# Patient Record
Sex: Female | Born: 1955
Health system: Southern US, Community
[De-identification: ages and names within clinical notes are randomized; demographics above are authoritative.]

## PROBLEM LIST (undated history)

## (undated) ENCOUNTER — Ambulatory Visit: Payer: Self-pay

## (undated) DIAGNOSIS — F419 Anxiety disorder, unspecified: Secondary | ICD-10-CM

## (undated) DIAGNOSIS — M19029 Primary osteoarthritis, unspecified elbow: Secondary | ICD-10-CM

## (undated) DIAGNOSIS — T7840XA Allergy, unspecified, initial encounter: Secondary | ICD-10-CM

## (undated) DIAGNOSIS — F32A Depression, unspecified: Secondary | ICD-10-CM

## (undated) DIAGNOSIS — F329 Major depressive disorder, single episode, unspecified: Secondary | ICD-10-CM

## (undated) HISTORY — DX: Anxiety disorder, unspecified: F41.9

## (undated) HISTORY — DX: Major depressive disorder, single episode, unspecified: F32.9

## (undated) HISTORY — PX: ABDOMINAL HYSTERECTOMY: SHX81

## (undated) HISTORY — DX: Allergy, unspecified, initial encounter: T78.40XA

## (undated) HISTORY — PX: TUBAL LIGATION: SHX77

## (undated) HISTORY — DX: Depression, unspecified: F32.A

## (undated) HISTORY — PX: NECK DISSECTION: SUR422

---

## 1997-11-02 ENCOUNTER — Other Ambulatory Visit: Admission: RE | Admit: 1997-11-02 | Discharge: 1997-11-02 | Payer: Self-pay | Admitting: Gynecology

## 1998-11-06 ENCOUNTER — Other Ambulatory Visit: Admission: RE | Admit: 1998-11-06 | Discharge: 1998-11-06 | Payer: Self-pay | Admitting: Gynecology

## 1999-07-02 ENCOUNTER — Encounter (INDEPENDENT_AMBULATORY_CARE_PROVIDER_SITE_OTHER): Payer: Self-pay

## 1999-07-02 ENCOUNTER — Inpatient Hospital Stay (HOSPITAL_COMMUNITY): Admission: RE | Admit: 1999-07-02 | Discharge: 1999-07-04 | Payer: Self-pay | Admitting: Gynecology

## 2001-10-09 ENCOUNTER — Encounter: Payer: Self-pay | Admitting: Internal Medicine

## 2001-10-09 ENCOUNTER — Encounter: Admission: RE | Admit: 2001-10-09 | Discharge: 2001-10-09 | Payer: Self-pay | Admitting: Internal Medicine

## 2001-11-05 ENCOUNTER — Encounter: Payer: Self-pay | Admitting: Internal Medicine

## 2001-11-05 ENCOUNTER — Encounter: Admission: RE | Admit: 2001-11-05 | Discharge: 2001-11-05 | Payer: Self-pay | Admitting: Internal Medicine

## 2002-03-19 ENCOUNTER — Encounter: Payer: Self-pay | Admitting: Internal Medicine

## 2002-03-19 ENCOUNTER — Encounter: Admission: RE | Admit: 2002-03-19 | Discharge: 2002-03-19 | Payer: Self-pay | Admitting: Internal Medicine

## 2002-08-16 ENCOUNTER — Other Ambulatory Visit: Admission: RE | Admit: 2002-08-16 | Discharge: 2002-08-16 | Payer: Self-pay | Admitting: Gynecology

## 2003-02-18 ENCOUNTER — Encounter: Admission: RE | Admit: 2003-02-18 | Discharge: 2003-02-18 | Payer: Self-pay | Admitting: Internal Medicine

## 2004-10-29 ENCOUNTER — Encounter: Admission: RE | Admit: 2004-10-29 | Discharge: 2004-10-29 | Payer: Self-pay | Admitting: Neurosurgery

## 2004-11-21 ENCOUNTER — Encounter: Admission: RE | Admit: 2004-11-21 | Discharge: 2004-11-21 | Payer: Self-pay | Admitting: Neurosurgery

## 2005-09-08 IMAGING — CT CT HEAD WO/W CM
1 of 2 series · 13 of 30 positions shown, 17 images · IV contrast (omnipaque)
Comparison: none

CLINICAL DATA: Retrobulbar, forehead, and lumbar pain.  Sulfur drug allergy.  (Contrast code: None).
 HEAD CT ? PRE- AND POST- CONTRAST ? 02/18/03
 Cranial CT was performed before and after administration of 100 cc of Omnipaque 300 cc Omnipaque 300 intravenous contrast.

[Series 2: brain · axial · 0.49mm/px · z∈[+27,+145]mm · 13 of 26 slices shown, 17 images]
[im 2/26  brain]
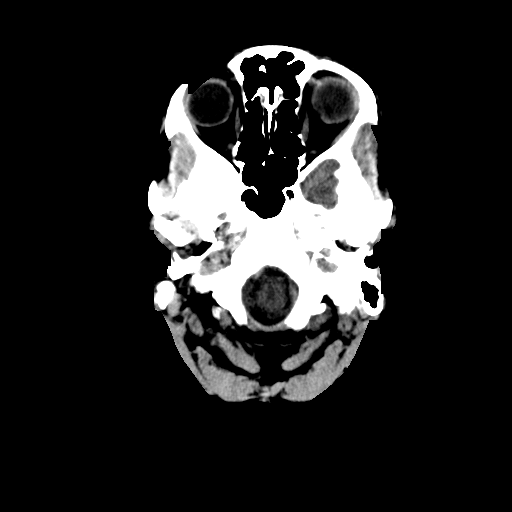
[im 2/26  bone]
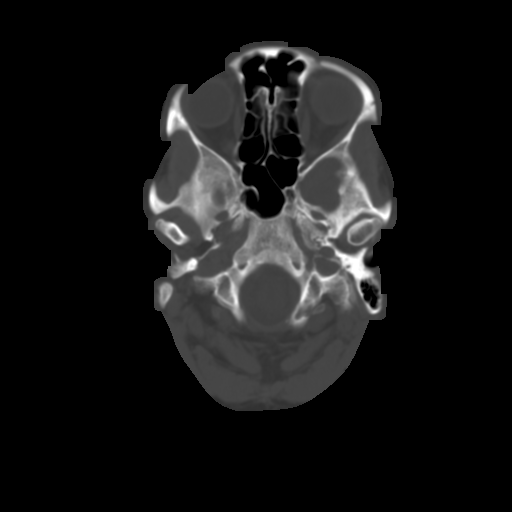
[im 4/26  brain]
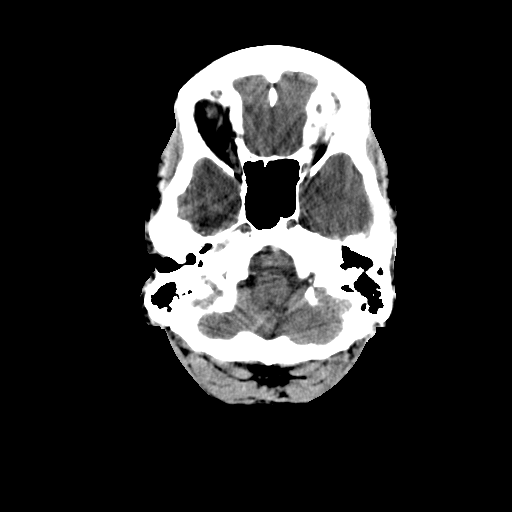
[im 6/26  brain]
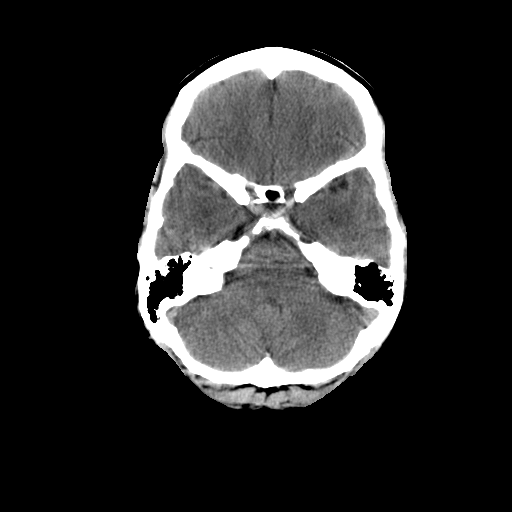
[im 8/26  brain]
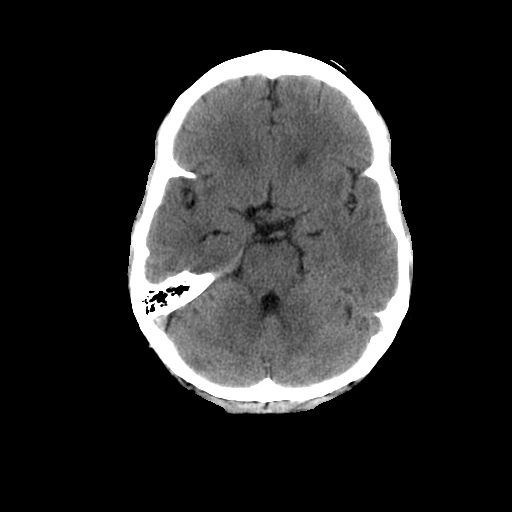
[im 9/26  brain]
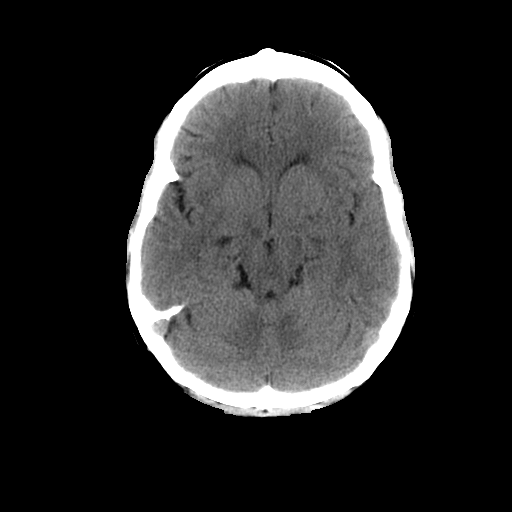
[im 9/26  bone]
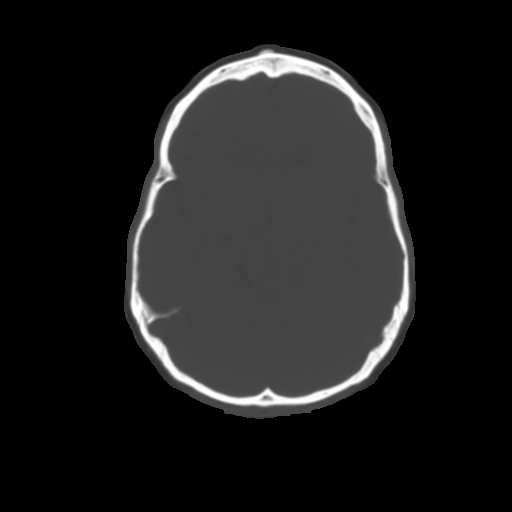
[im 11/26  brain]
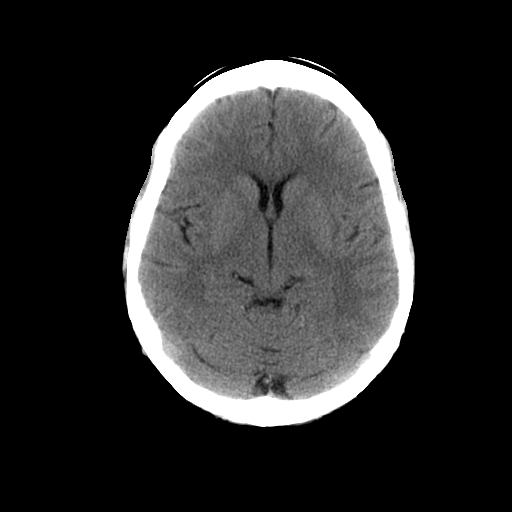
[im 13/26  brain]
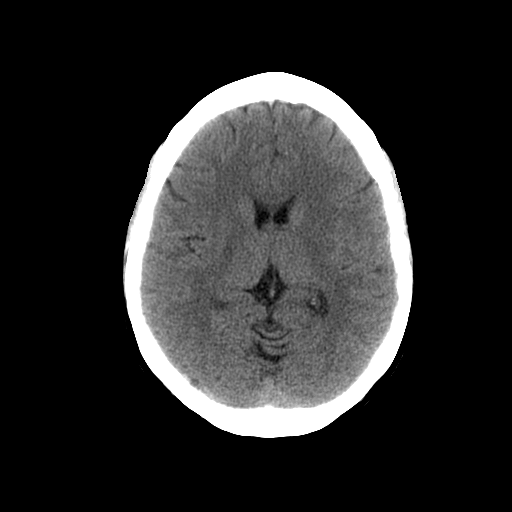
[im 15/26  brain]
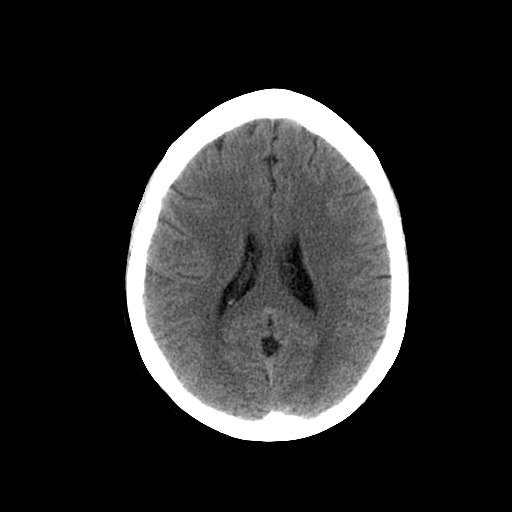
[im 17/26  brain]
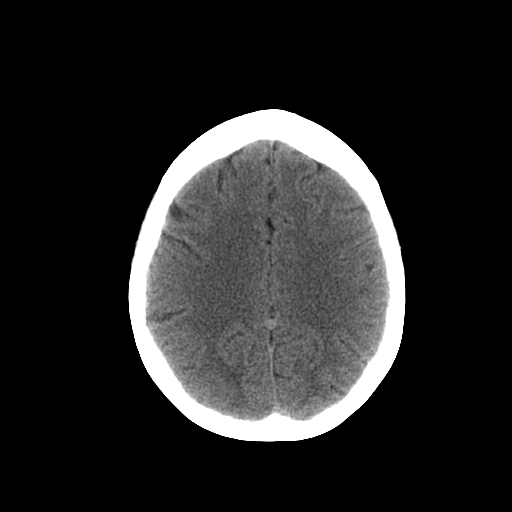
[im 17/26  bone]
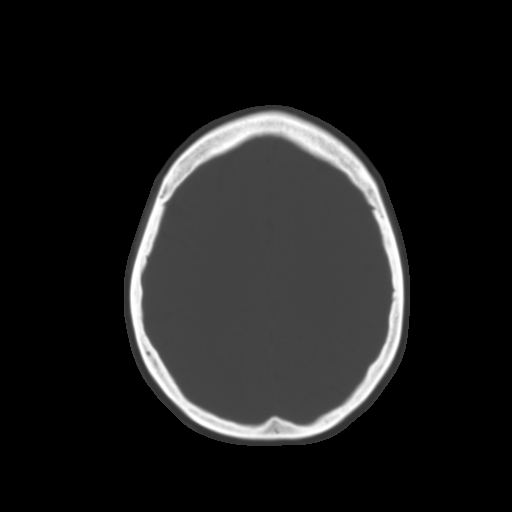
[im 18/26  brain]
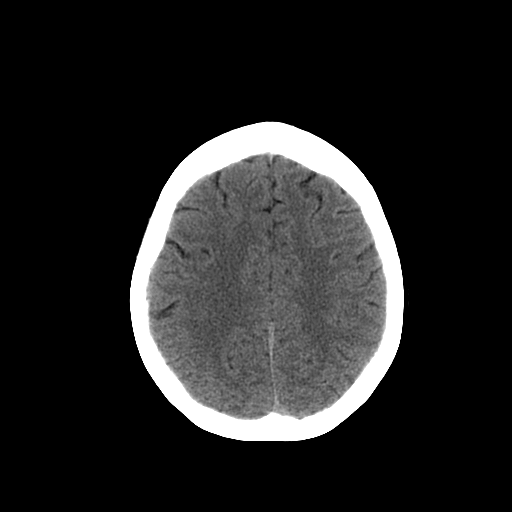
[im 20/26  brain]
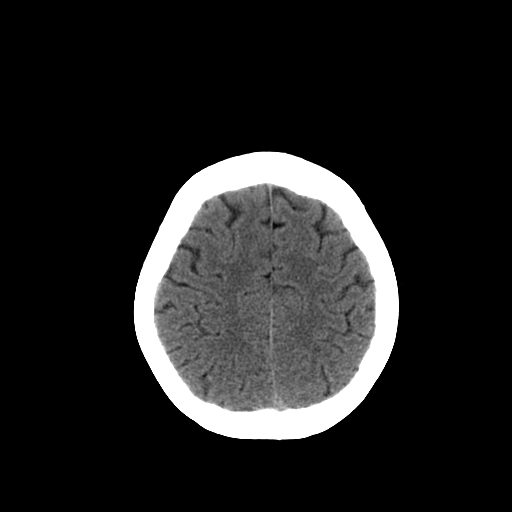
[im 22/26  brain]
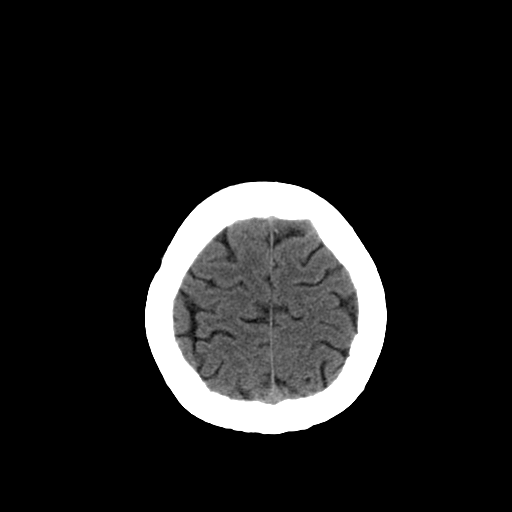
[im 24/26  brain]
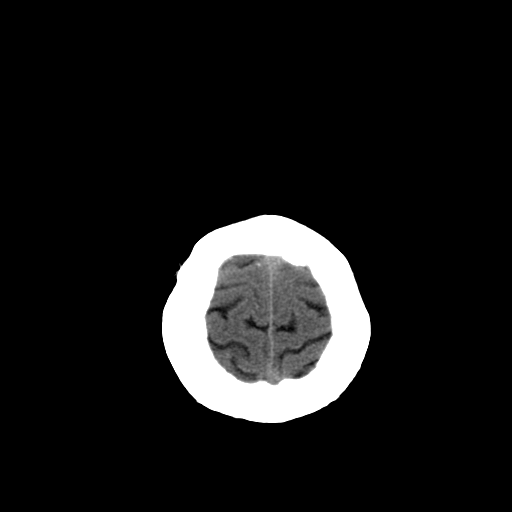
[im 24/26  bone]
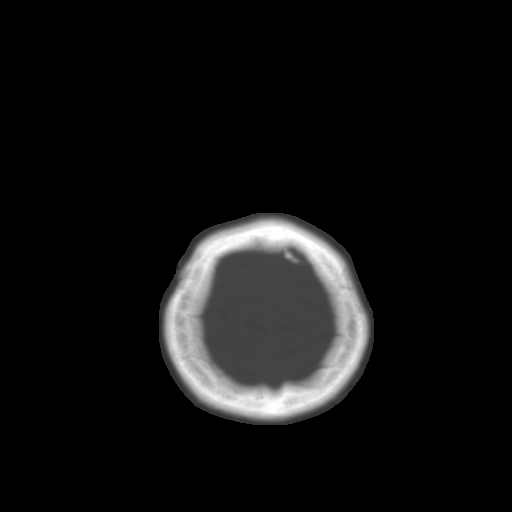

[13 of 30 positions shown; findings below may reference images not displayed]

There is no evidence of enhancing lesions, brain edema, mass effect or intracranial hemorrhage. The ventricles are normal. No extra-axial abnormalities are identified. Bone windows show no significant abnormality.

 No change since the previous [REDACTED] head CT 03/19/02 is noted.  With aid of retrospection a small benign appearing hyperdense well-marginated focus is seen at the midline subgaleal frontal region (image #8, previous image #14).

 IMPRESSION
 Since [REDACTED] head CT 03/19/02, no interval change:
 1.  Well marginated benign appearing frontal midline subgaleal hyperdense focus measuring 2 mm AP x 6 mm wide with no subjacent osseous abnormality.
 2.  Otherwise normal.

## 2006-01-06 ENCOUNTER — Encounter: Admission: RE | Admit: 2006-01-06 | Discharge: 2006-01-06 | Payer: Self-pay | Admitting: Neurosurgery

## 2006-01-27 ENCOUNTER — Encounter: Admission: RE | Admit: 2006-01-27 | Discharge: 2006-01-27 | Payer: Self-pay | Admitting: Neurosurgery

## 2006-04-01 ENCOUNTER — Encounter: Admission: RE | Admit: 2006-04-01 | Discharge: 2006-04-01 | Payer: Self-pay | Admitting: Neurosurgery

## 2006-07-28 ENCOUNTER — Encounter (INDEPENDENT_AMBULATORY_CARE_PROVIDER_SITE_OTHER): Payer: Self-pay | Admitting: Orthopedic Surgery

## 2006-07-28 ENCOUNTER — Ambulatory Visit: Payer: Self-pay | Admitting: Vascular Surgery

## 2006-07-28 ENCOUNTER — Ambulatory Visit (HOSPITAL_COMMUNITY): Admission: RE | Admit: 2006-07-28 | Discharge: 2006-07-28 | Payer: Self-pay | Admitting: Orthopedic Surgery

## 2006-08-05 ENCOUNTER — Encounter (HOSPITAL_COMMUNITY): Admission: RE | Admit: 2006-08-05 | Discharge: 2006-09-04 | Payer: Self-pay | Admitting: Orthopedic Surgery

## 2006-11-05 ENCOUNTER — Encounter: Admission: RE | Admit: 2006-11-05 | Discharge: 2006-11-05 | Payer: Self-pay | Admitting: Neurosurgery

## 2007-01-07 ENCOUNTER — Encounter: Admission: RE | Admit: 2007-01-07 | Discharge: 2007-01-07 | Payer: Self-pay | Admitting: Neurosurgery

## 2007-04-01 ENCOUNTER — Inpatient Hospital Stay (HOSPITAL_COMMUNITY): Admission: RE | Admit: 2007-04-01 | Discharge: 2007-04-02 | Payer: Self-pay | Admitting: Neurosurgery

## 2009-10-14 IMAGING — CR DG CHEST 2V
2 series · 2 of 2 positions shown · non-contrast
Comparison: None.

CLINICAL DATA: Cervical spondylosis.  
 CHEST - 2 VIEW:

[view not recorded (1 of 2)]
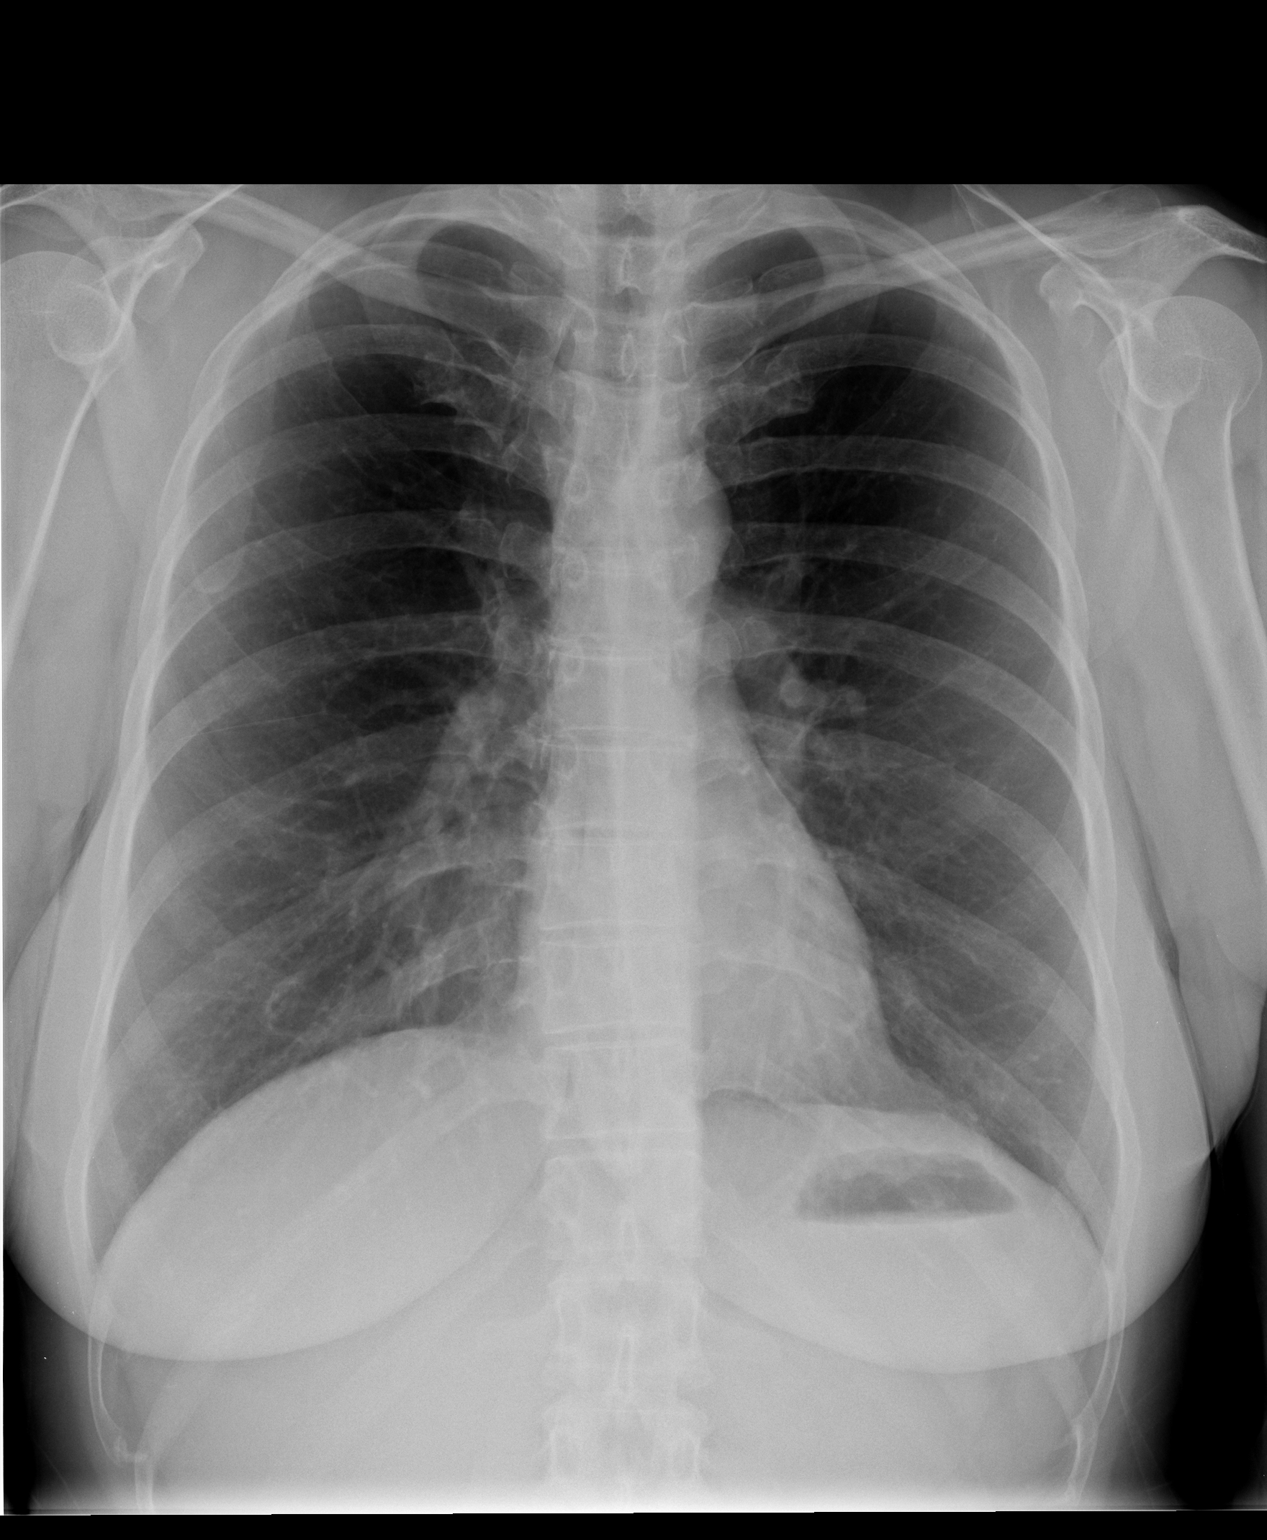

[view not recorded (2 of 2)]
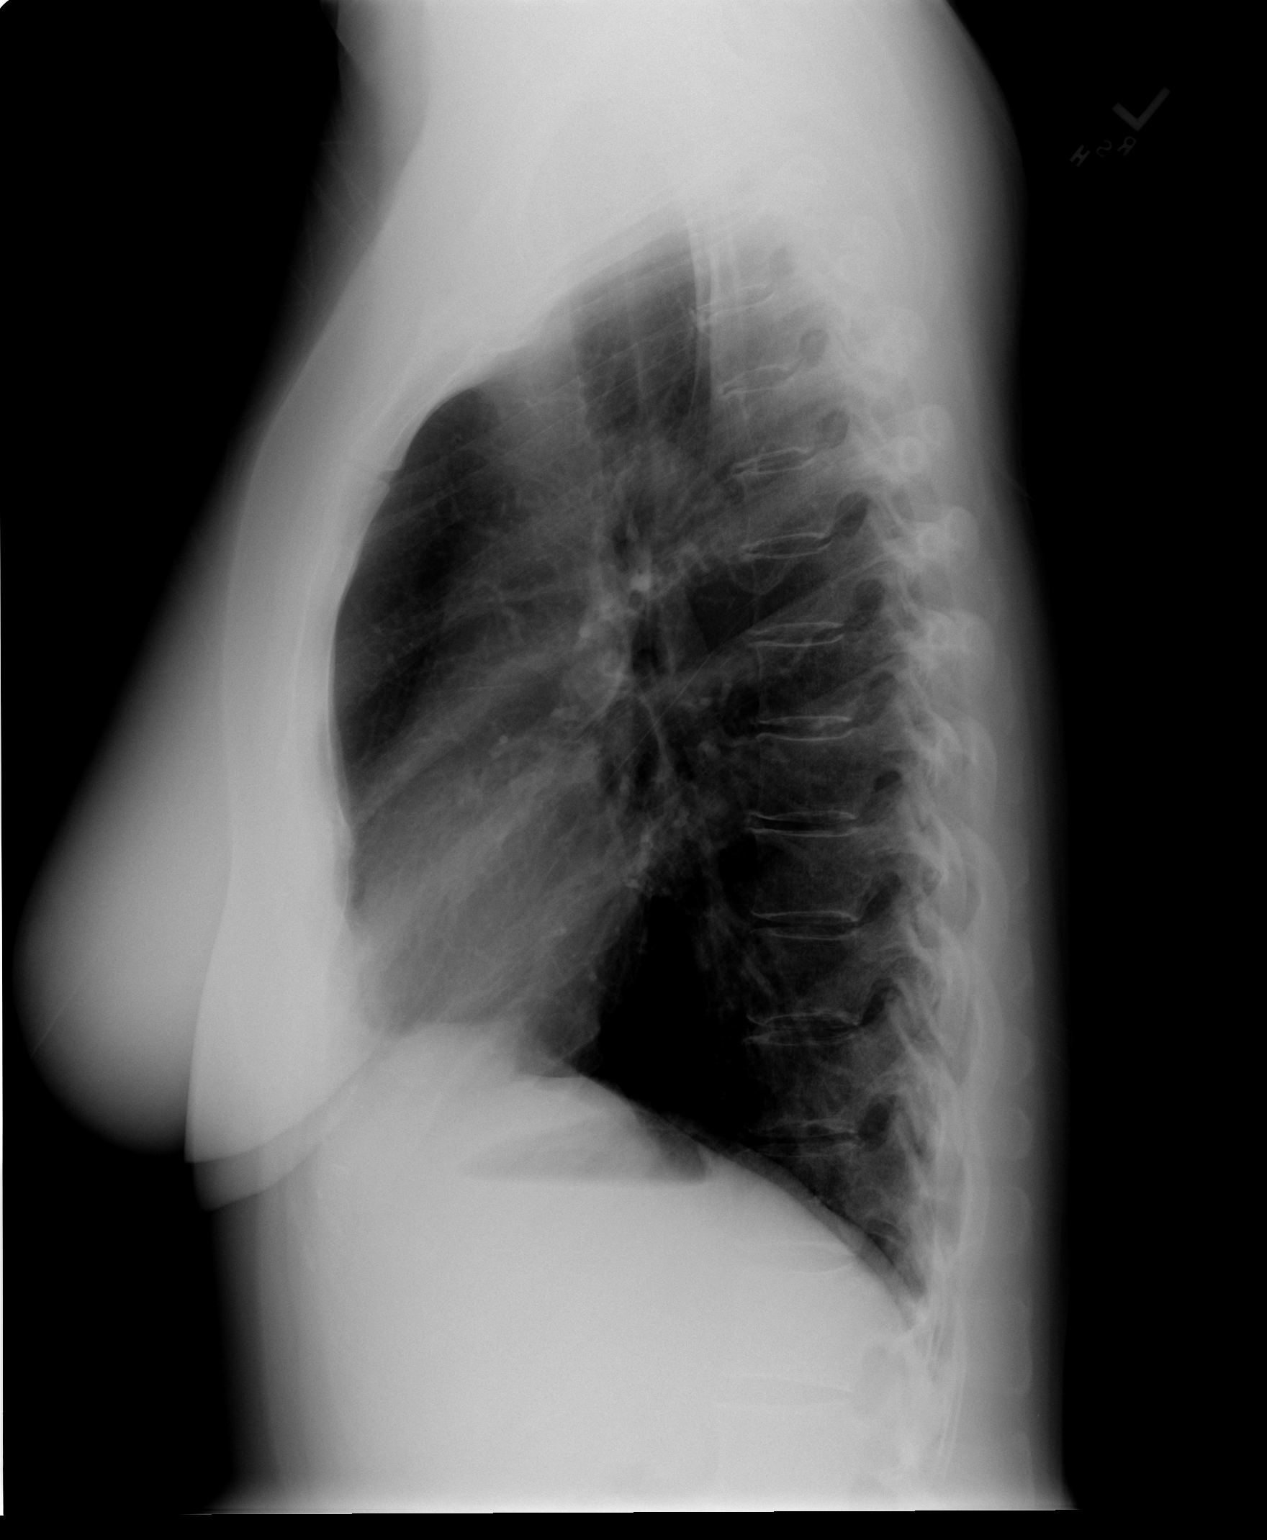

[2 of 2 positions shown; findings below may reference images not displayed]

FINDINGS: Lungs clear.  Heart size normal.  No pleural effusion.
IMPRESSION: No acute disease.

## 2010-02-18 ENCOUNTER — Encounter: Payer: Self-pay | Admitting: Neurosurgery

## 2010-06-12 NOTE — H&P (Signed)
Christy Stanley, KERSHAW NO.:  [id number]   MEDICAL RECORD NO.:  [id number]          PATIENT TYPE:  INP   LOCATION:  3013                         FACILITY:  [facility]   PHYSICIAN:  Payton Doughty, M.D.      DATE OF BIRTH:  Dec 14, 1955   DATE OF ADMISSION:  04/01/2007  DATE OF DISCHARGE:                              HISTORY & PHYSICAL   ADMISSION DIAGNOSIS:  Cervical spondylosis C5-C6, C6-C7.   Ms. Christy Stanley is a 55 year old right-handed white woman with head,  shoulder, and neck pain off and on for several  years on the left side.  It was not getting down to her arm initially; now it is.  She has  noticed functional weakness left arm performing tasks around the  household. She is doing some exercises because her neck started hurting  on the left.  MR was obtained in the cervical spine that shows  spondylosis at 5-6, 6-7, and she is now admitted for anterior  decompression and fusion.   PAST MEDICAL HISTORY:  Benign.   MEDICATIONS:  She uses Zoloft and Ativan.   ALLERGIES:  She is allergic to SULFA.   PAST SURGICAL HISTORY:  Hysterectomy in 2001.   FAMILY HISTORY:  Mom is deceased with ulcers and heart disease.  Dad is  age 27 in good health.   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   REVIEW OF SYSTEMS:  Remarkable for neck pain, shoulder pain.   PHYSICAL EXAMINATION:  HEENT:  Within normal limits.  She has good range  of motion in her neck.  CHEST:  Clear.  CARDIAC:  Regular rate and rhythm.  ABDOMEN:  Soft, nontender with no hepatosplenomegaly.  EXTREMITIES:  Without clubbing or cyanosis.  GU:  Deferred.  EXTREMITIES:  Peripheral pulses are good.  NEUROLOGICAL:  She is awake, alert and oriented, cranial nerves are  intact.  Motor exam shows 5/5 strength throughout the upper and lower  extremities save the left triceps 4+/5.  Sensory:  Dysesthesias are in  the left C7 distribution.  Deep tendon reflexes are 2 at the biceps, 2  at the  triceps, 2 at the right triceps, flicker at the left, 1 at the  brachioradialis, 2 at the knees, 2 at the ankles.  Toes downgoing  bilaterally.  Hoffmann's is negative.  MR shows spondylosis at C5-C6 and  C6-C7 worse off the left and the right with core compression.   CLINICAL IMPRESSION:  Cervical spondylosis, early myelopathy.   PLAN:  For anterior decompression fusion at C5-C6 and C6-C7.  The risks  and benefits have been discussed with her, and she wishes to proceed.     .           ______________________________  Payton Doughty, M.D.     MWR/MEDQ  D:  04/01/2007  T:  04/01/2007  Job:  [id number]

## 2010-06-12 NOTE — Op Note (Signed)
NAMEBRYTNEY, SOMES NO.:  [id number]   MEDICAL RECORD NO.:  [id number]          PATIENT TYPE:  INP   LOCATION:  3013                         FACILITY:  [facility]   PHYSICIAN:  Payton Doughty, M.D.      DATE OF BIRTH:  02/19/1955   DATE OF PROCEDURE:  04/01/2007  DATE OF DISCHARGE:                               OPERATIVE REPORT   PREOPERATIVE DIAGNOSIS:  Spondylosis C5-6 and C6-7.   POSTOPERATIVE DIAGNOSIS:  Spondylosis C5-6 and C6-7.   OPERATIVE PROCEDURE:  C5-6 and C6-7 anterior cervical decompression and  fusion with Reflex Hybrid plate.   SURGEON:  Trey Sailors, M.D.   ANESTHESIA:  General endotracheal.   __________   COMPLICATIONS:  None.   NURSE ASSISTANT:  [place].   DOCTOR ASSISTANT:  Phoebe Perch.   This is a 55 year old girl with spondylosis and myelopathy at C5-6 and  C6-7 taken to the operating room, smoothly anesthetized and intubated,  placed supine on the operating table in the halter head traction with  the neck extended.  Following shave, prep, and drape in the usual  sterile fashion, the skin was incised in the midline of the medial  border sternocleidomastoid muscle on the left side.  The platysma was  identified, elevated, divided and undermined.  The sternocleidomastoid  was identified and medial dissection revealed the carotid artery,  retracted laterally and the trachea and esophagus retracted laterally to  the right, exposing the bones of the anterior cervical spine.  Markers  was placed.  Intraoperative x-ray obtained and confirmed  correct disk  level.  Having confirmed correct disk level, diskectomy was carried out  under gross observation at both C5-6 and C6-7.  The operating microscope  was then brought in.  We used microdissection technique to remove the  remaining disk, divide the posterior annulus, divide the posterior  longitudinal ligament and decompress the neural foramina bilaterally.  Significant compression existed at both  levels.  It was probably a  little bit worse at C5-6, but it was definitely worse on the left at  both levels.  Following complete decompression, 8-mm bone grafts were  fashioned from __________  allograft and tapped into place.  A Reflex  Hybrid plate, 34 mm was placed using 12-mm screws, two in C5, two in C6  and two in C7.  Intraoperative x-ray showed good placement of bone graft, plate and  screws.  The wound was irrigated.  Hemostasis assured.  Successive  layers of 3-0 Vicryl and 4-0 Vicryl were used to close.  Benzoin and  Steri-Strips were placed, made occlusive with Telfa and OpSite, and the  patient returned to recovery room in good condition.           ______________________________  Payton Doughty, M.D.     MWR/MEDQ  D:  04/01/2007  T:  04/01/2007  Job:  [id number]

## 2010-06-15 NOTE — Op Note (Signed)
[facility]  Patient:    Christy Stanley, Christy Stanley                      MRN: [id number] Proc. Date: 07/02/99 Adm. Date:  [id number] Attending:  Susa Raring                           Operative Report  PREOPERATIVE DIAGNOSIS:  Large uterine myoma.  POSTOPERATIVE DIAGNOSIS:  Large uterine myoma.  OPERATION:  Total abdominal hysterectomy.  SURGEON:  Luvenia Redden, M.D.  ASSISTANT:  Gerrit Friends. Aldona Bar, M.D.  DESCRIPTION OF PROCEDURE:  Under good anesthesia, the patient was prepped and draped in a sterile manner.  A Foley catheter was placed in the bladder. Abdomen was entered through an old Pfannenstiel scar.  There were a lot of adhesions between the fascia and the muscle and it was difficult to get it separated but this was accomplished after some difficulty.  Bleeders were cauterized as encountered.  The peritoneum was entered sharply and opened vertically.  Abdominal organs were explored manually.  Liver felt smooth. Kidneys were normal.  There were no masses in the upper abdomen.  Retractor was placed in the incision and intestines were packed off into the upper abdomen with wet lap pads.  The uterus was enlarged and irregular, predominantly with a large fibroid in the fundus.  Tubes and ovaries appeared normal.  Round ligaments were clamped, cut and ligated and bladder flap was developed by sharp and blunt dissection.  Uteroovarian ligaments were clamped, cut and doubly ligated.  Broad ligaments including uterine vessels were clamped, cut and ligated.  The cardinal ligaments were clamped, cut and ligated.  Bladder was further dissected down off the cervix by blunt dissection.  The uterosacral ligaments were clamped, cut and ligated.  The vagina was entered anteriorly, the cervix circumcised and the specimen removed.  Bladder wall and vaginal apices were secured with a figure-of-eight 0 Monocryl sutures and the cuff was closed with figure-of-eight 0  Monocryl sutures.  Individual bleeders, of which there were many underneath the bladder flap, were controlled by electrocoagulation.  The ovaries were then suspended out of the pelvis by suturing the uteroovarian ligament stumps to the round ligament stumps.  Uterosacral ligaments were plicated into the midline to prevent vaginal prolapse.  Bladder peritoneum was pulled down over the cuff with a single suture.  Pelvic was irrigated copiously and this was aspirated. Packs were removed.  Instruments were removed.  The appendix was inspected and was perfectly normal; it was not removed.  After all the counts were correct, the peritoneum was closed with continuous 3-0 Monocryl, the fascia was repaired with continuous 0 Monocryl, subcutaneous tissues approximated with interrupted 3-0 plain catgut and the skin edges were closed with wide skin staples.  A dry sterile dressing was applied.  Estimated blood loss was 350 cc; none was replaced.  The patient tolerated the procedure well.  There was clear urine coming from the catheter at the end of the procedure.  She was removed to recovery in good condition. DD:  07/02/99 TD:  07/04/99 Job: [id number]

## 2010-06-15 NOTE — Discharge Summary (Signed)
[facility]  Patient:    Christy Stanley, Christy Stanley                      MRN: [id number] Adm. Date:  [id number] Disc. Date: [id number] Attending:  Susa Raring                           Discharge Summary  HISTORY OF PRESENT ILLNESS:  The patient is a 55 year old, gravida 4, para 2, admitted to the hospital because of enlarging uterine myoma, pelvic pain, dyspareunia, dysmenorrhea, and had rapid enlargement of her fibroids in the last few months, and she is now admitted for a hysterectomy.  PHYSICAL EXAMINATION:  Pertinent findings on physical were limited to the pelvis where the uterus was enlarged and anterior and irregular and about [redacted] weeks gestation size.  No separate adnexal masses could be palpated.  LABORATORY DATA:  Preoperative hemoglobin was 14, hematocrit 40, white count, differentials were normal.  Postoperative hemoglobin was 11.1 with hematocrit of 31.  Repeat CBC on July 04, 1999, showed a hemoglobin of 11, white count of 8.6 thousand, with a slightly shifted differential with 76 neutrophils.  Urinalysis preoperatively was normal for a voided specimen. Pregnancy test was negative.  HOSPITAL COURSE:  The patient was taken to the operating room where a total abdominal hysterectomy was done without complication.  The patients postoperative course was one of steady improvement.  On the night before discharge, she did spike a temperature to 101; however, this remitted spontaneously.  A CBC done showed a normal white count; therefore, her staples were removed, and she was discharged to her home.  The patient was instructions as to limited activity, no vaginal penetration, no driving for two weeks.  She is to return to my office in four weeks time for follow-up care.  She was given a prescription for [facility] for pain.  Tissue removed showed benign fibroids, benign proliferative endometrium.  FINAL DIAGNOSES: 1. Benign cellular leiomyoma. 2.  Menorrhagia secondary to #1. 3. Chronic pelvic pain, pressure, dysmenorrhea, and dyspareunia secondary to    #1.  OPERATIONS:  Total abdominal hysterectomy.  CONDITION ON DISCHARGE:  Improved. DD:  08/27/99 TD:  08/28/99 Job: [id number]

## 2010-10-19 LAB — COMPREHENSIVE METABOLIC PANEL
Alkaline Phosphatase: 60
BUN: 10
Calcium: 9.4
Creatinine, Ser: 0.89
Glucose, Bld: 93
Potassium: 4
Total Protein: 6.5

## 2010-10-19 LAB — DIFFERENTIAL
Basophils Relative: 0
Monocytes Relative: 9
Neutro Abs: 3.2
Neutrophils Relative %: 54

## 2010-10-19 LAB — URINALYSIS, ROUTINE W REFLEX MICROSCOPIC
Bilirubin Urine: NEGATIVE
Ketones, ur: NEGATIVE
Nitrite: NEGATIVE
Specific Gravity, Urine: 1.007
Urobilinogen, UA: 0.2

## 2010-10-19 LAB — CBC
HCT: 41.4
Hemoglobin: 14.3
MCHC: 34.5
MCV: 92.7
RDW: 11.8

## 2010-10-19 LAB — PROTIME-INR: INR: 1.1

## 2010-10-19 LAB — APTT: aPTT: 27

## 2012-12-28 ENCOUNTER — Ambulatory Visit (INDEPENDENT_AMBULATORY_CARE_PROVIDER_SITE_OTHER): Payer: BC Managed Care – PPO

## 2012-12-28 ENCOUNTER — Ambulatory Visit (INDEPENDENT_AMBULATORY_CARE_PROVIDER_SITE_OTHER): Payer: BC Managed Care – PPO | Admitting: Podiatry

## 2012-12-28 ENCOUNTER — Encounter: Payer: Self-pay | Admitting: Podiatry

## 2012-12-28 VITALS — BP 128/75 | HR 79 | Resp 12 | Ht 64.0 in | Wt 150.0 lb

## 2012-12-28 DIAGNOSIS — M79672 Pain in left foot: Secondary | ICD-10-CM

## 2012-12-28 DIAGNOSIS — M722 Plantar fascial fibromatosis: Secondary | ICD-10-CM

## 2012-12-28 DIAGNOSIS — M79609 Pain in unspecified limb: Secondary | ICD-10-CM

## 2012-12-28 MED ORDER — TRIAMCINOLONE ACETONIDE 10 MG/ML IJ SUSP
10.0000 mg | Freq: Once | INTRAMUSCULAR | Status: AC
  Administered 2012-12-28: 10 mg

## 2012-12-28 MED ORDER — MELOXICAM 15 MG PO TABS: 15.0000 mg | ORAL_TABLET | Freq: Every day | ORAL | Status: DC

## 2012-12-28 NOTE — Progress Notes (Signed)
   Subjective:    Patient ID: Christy Stanley, female    DOB: Aug 19, 1955, 57 y.o.   MRN: [id number] "I'm having trouble with my left foot mainly in the heel."   Foot Pain This is a new problem. Episode onset: May. The problem occurs daily. The problem has been gradually worsening. The symptoms are aggravated by standing and walking (barefoot, get up after sitting a while.). She has tried ice and NSAIDs (wearing orthotics, good tennis shoes) for the symptoms. The treatment provided mild relief.   Patient has increased her physical activity walking up to 1 mile a day 3 times a week. In addition she has an old custom orthotic from previous foot pain.   Review of Systems  HENT:       Sinus problem  Endocrine: Positive for polydipsia.  All other systems reviewed and are negative.       Objective:   Physical Exam  Subjective: Orientated x77 57 year old white female  Vascular: DP and PT pulses are two over four bilaterally  Neurological: Knee and ankle reflexes equal and reactive bilaterally  Dermatological: Texture and turgor within normal limits  Musculoskeletal: Palpable tenderness in the inferior left heel, proximal one third of the medial fascial band left and over the medial navicular area left. No palpable lesions in any of these areas of tenderness.        Assessment & Plan:   Assessment: Plantar fasciitis left Mild insertional posterior tibial tendinitis left  Plan: Skin is prepped with alcohol and Betadine and 10 mg of Kenalog mixed with 10 mg of plain Xylocaine and 2.5 mg of plain Marcaine injected inferior heel left for Kenalog injection #1. Fasciitis strap dispensed the ward of left foot. Patient will continue wear her existing custom orthotics with a fasciitis strap. Mobic 15 mg #30 take 1 by mouth daily. Patient return his symptoms are not improve in the next 30 days.

## 2012-12-28 NOTE — Patient Instructions (Signed)
Plantar Fasciitis (Heel Spur Syndrome)  with Rehab  The plantar fascia is a fibrous, ligament-like, soft-tissue structure that spans the bottom of the foot. Plantar fasciitis is a condition that causes pain in the foot due to inflammation of the tissue.  SYMPTOMS   · Pain and tenderness on the underneath side of the foot.  · Pain that worsens with standing or walking.  CAUSES   Plantar fasciitis is caused by irritation and injury to the plantar fascia on the underneath side of the foot. Common mechanisms of injury include:  · Direct trauma to bottom of the foot.  · Damage to a small nerve that runs under the foot where the main fascia attaches to the heel bone.  · Stress placed on the plantar fascia due to bone spurs.  RISK INCREASES WITH:   · Activities that place stress on the plantar fascia (running, jumping, pivoting, or cutting).  · Poor strength and flexibility.  · Improperly fitted shoes.  · Tight calf muscles.  · Flat feet.  · Failure to warm-up properly before activity.  · Obesity.  PREVENTION  · Warm up and stretch properly before activity.  · Allow for adequate recovery between workouts.  · Maintain physical fitness:  · Strength, flexibility, and endurance.  · Cardiovascular fitness.  · Maintain a health body weight.  · Avoid stress on the plantar fascia.  · Wear properly fitted shoes, including arch supports for individuals who have flat feet.  PROGNOSIS   If treated properly, then the symptoms of plantar fasciitis usually resolve without surgery. However, occasionally surgery is necessary.  RELATED COMPLICATIONS   · Recurrent symptoms that may result in a chronic condition.  · Problems of the lower back that are caused by compensating for the injury, such as limping.  · Pain or weakness of the foot during push-off following surgery.  · Chronic inflammation, scarring, and partial or complete fascia tear, occurring more often from repeated injections.  TREATMENT   Treatment initially involves the use of  ice and medication to help reduce pain and inflammation. The use of strengthening and stretching exercises may help reduce pain with activity, especially stretches of the Achilles tendon. These exercises may be performed at home or with a therapist. Your caregiver may recommend that you use heel cups of arch supports to help reduce stress on the plantar fascia. Occasionally, corticosteroid injections are given to reduce inflammation. If symptoms persist for greater than 6 months despite non-surgical (conservative), then surgery may be recommended.   MEDICATION   · If pain medication is necessary, then nonsteroidal anti-inflammatory medications, such as aspirin and ibuprofen, or other minor pain relievers, such as acetaminophen, are often recommended.  · Do not take pain medication within 7 days before surgery.  · Prescription pain relievers may be given if deemed necessary by your caregiver. Use only as directed and only as much as you need.  · Corticosteroid injections may be given by your caregiver. These injections should be reserved for the most serious cases, because they may only be given a certain number of times.  HEAT AND COLD  · Cold treatment (icing) relieves pain and reduces inflammation. Cold treatment should be applied for 10 to 15 minutes every 2 to 3 hours for inflammation and pain and immediately after any activity that aggravates your symptoms. Use ice packs or massage the area with a piece of ice (ice massage).  · Heat treatment may be used prior to performing the stretching and strengthening activities prescribed   by your caregiver, physical therapist, or athletic trainer. Use a heat pack or soak the injury in warm water.  SEEK IMMEDIATE MEDICAL CARE IF:  · Treatment seems to offer no benefit, or the condition worsens.  · Any medications produce adverse side effects.  EXERCISES  RANGE OF MOTION (ROM) AND STRETCHING EXERCISES - Plantar Fasciitis (Heel Spur Syndrome)  These exercises may help you  when beginning to rehabilitate your injury. Your symptoms may resolve with or without further involvement from your physician, physical therapist or athletic trainer. While completing these exercises, remember:   · Restoring tissue flexibility helps normal motion to return to the joints. This allows healthier, less painful movement and activity.  · An effective stretch should be held for at least 30 seconds.  · A stretch should never be painful. You should only feel a gentle lengthening or release in the stretched tissue.  RANGE OF MOTION - Toe Extension, Flexion  · Sit with your right / left leg crossed over your opposite knee.  · Grasp your toes and gently pull them back toward the top of your foot. You should feel a stretch on the bottom of your toes and/or foot.  · Hold this stretch for __________ seconds.  · Now, gently pull your toes toward the bottom of your foot. You should feel a stretch on the top of your toes and or foot.  · Hold this stretch for __________ seconds.  Repeat __________ times. Complete this stretch __________ times per day.   RANGE OF MOTION - Ankle Dorsiflexion, Active Assisted  · Remove shoes and sit on a chair that is preferably not on a carpeted surface.  · Place right / left foot under knee. Extend your opposite leg for support.  · Keeping your heel down, slide your right / left foot back toward the chair until you feel a stretch at your ankle or calf. If you do not feel a stretch, slide your bottom forward to the edge of the chair, while still keeping your heel down.  · Hold this stretch for __________ seconds.  Repeat __________ times. Complete this stretch __________ times per day.   STRETCH  Gastroc, Standing  · Place hands on wall.  · Extend right / left leg, keeping the front knee somewhat bent.  · Slightly point your toes inward on your back foot.  · Keeping your right / left heel on the floor and your knee straight, shift your weight toward the wall, not allowing your back to  arch.  · You should feel a gentle stretch in the right / left calf. Hold this position for __________ seconds.  Repeat __________ times. Complete this stretch __________ times per day.  STRETCH  Soleus, Standing  · Place hands on wall.  · Extend right / left leg, keeping the other knee somewhat bent.  · Slightly point your toes inward on your back foot.  · Keep your right / left heel on the floor, bend your back knee, and slightly shift your weight over the back leg so that you feel a gentle stretch deep in your back calf.  · Hold this position for __________ seconds.  Repeat __________ times. Complete this stretch __________ times per day.  STRETCH  Gastrocsoleus, Standing   Note: This exercise can place a lot of stress on your foot and ankle. Please complete this exercise only if specifically instructed by your caregiver.   · Place the ball of your right / left foot on a step, keeping   your other foot firmly on the same step.  · Hold on to the wall or a rail for balance.  · Slowly lift your other foot, allowing your body weight to press your heel down over the edge of the step.  · You should feel a stretch in your right / left calf.  · Hold this position for __________ seconds.  · Repeat this exercise with a slight bend in your right / left knee.  Repeat __________ times. Complete this stretch __________ times per day.   STRENGTHENING EXERCISES - Plantar Fasciitis (Heel Spur Syndrome)   These exercises may help you when beginning to rehabilitate your injury. They may resolve your symptoms with or without further involvement from your physician, physical therapist or athletic trainer. While completing these exercises, remember:   · Muscles can gain both the endurance and the strength needed for everyday activities through controlled exercises.  · Complete these exercises as instructed by your physician, physical therapist or athletic trainer. Progress the resistance and repetitions only as guided.  STRENGTH - Towel  Curls  · Sit in a chair positioned on a non-carpeted surface.  · Place your foot on a towel, keeping your heel on the floor.  · Pull the towel toward your heel by only curling your toes. Keep your heel on the floor.  · If instructed by your physician, physical therapist or athletic trainer, add ____________________ at the end of the towel.  Repeat __________ times. Complete this exercise __________ times per day.  STRENGTH - Ankle Inversion  · Secure one end of a rubber exercise band/tubing to a fixed object (table, pole). Loop the other end around your foot just before your toes.  · Place your fists between your knees. This will focus your strengthening at your ankle.  · Slowly, pull your big toe up and in, making sure the band/tubing is positioned to resist the entire motion.  · Hold this position for __________ seconds.  · Have your muscles resist the band/tubing as it slowly pulls your foot back to the starting position.  Repeat __________ times. Complete this exercises __________ times per day.   Document Released: 01/14/2005 Document Revised: 04/08/2011 Document Reviewed: 04/28/2008  [employer]® Patient Information ©2014 [employer].

## 2014-12-28 ENCOUNTER — Other Ambulatory Visit: Payer: Self-pay | Admitting: Gastroenterology

## 2015-02-28 ENCOUNTER — Encounter (HOSPITAL_COMMUNITY): Payer: Self-pay | Admitting: *Deleted

## 2015-03-13 ENCOUNTER — Encounter (HOSPITAL_COMMUNITY): Payer: Self-pay

## 2015-03-13 ENCOUNTER — Encounter (HOSPITAL_COMMUNITY)
Admission: RE | Disposition: A | Discharge status: Home / Self Care | Payer: Self-pay | Source: Ambulatory Visit | Attending: Gastroenterology

## 2015-03-13 ENCOUNTER — Ambulatory Visit (HOSPITAL_COMMUNITY): Payer: BLUE CROSS/BLUE SHIELD | Admitting: Anesthesiology

## 2015-03-13 ENCOUNTER — Ambulatory Visit (HOSPITAL_COMMUNITY)
Admission: RE | Admit: 2015-03-13 | Discharge: 2015-03-13 | Disposition: A | Discharge status: Home / Self Care | Payer: BLUE CROSS/BLUE SHIELD | Source: Ambulatory Visit | Attending: Gastroenterology | Admitting: Gastroenterology

## 2015-03-13 DIAGNOSIS — Z1211 Encounter for screening for malignant neoplasm of colon: Secondary | ICD-10-CM | POA: Diagnosis not present

## 2015-03-13 DIAGNOSIS — K589 Irritable bowel syndrome without diarrhea: Secondary | ICD-10-CM | POA: Insufficient documentation

## 2015-03-13 DIAGNOSIS — Z90710 Acquired absence of both cervix and uterus: Secondary | ICD-10-CM | POA: Diagnosis not present

## 2015-03-13 DIAGNOSIS — K5730 Diverticulosis of large intestine without perforation or abscess without bleeding: Secondary | ICD-10-CM | POA: Insufficient documentation

## 2015-03-13 HISTORY — DX: Primary osteoarthritis, unspecified elbow: M19.029

## 2015-03-13 HISTORY — PX: COLONOSCOPY WITH PROPOFOL: SHX5780

## 2015-03-13 SURGERY — COLONOSCOPY WITH PROPOFOL
Anesthesia: Monitor Anesthesia Care

## 2015-03-13 MED ORDER — LACTATED RINGERS IV SOLN
INTRAVENOUS | Status: DC
  Administered 2015-03-13: 1000 mL via INTRAVENOUS

## 2015-03-13 MED ORDER — SODIUM CHLORIDE 0.9 % IV SOLN: INTRAVENOUS | Status: DC

## 2015-03-13 MED ORDER — PROPOFOL 10 MG/ML IV BOLUS
INTRAVENOUS | Status: AC
  Filled 2015-03-13: qty 60

## 2015-03-13 MED ORDER — PROPOFOL 10 MG/ML IV BOLUS
INTRAVENOUS | Status: DC | PRN
  Administered 2015-03-13: 30 mg via INTRAVENOUS
  Administered 2015-03-13 (×3): 20 mg via INTRAVENOUS

## 2015-03-13 MED ORDER — PROPOFOL 500 MG/50ML IV EMUL
INTRAVENOUS | Status: DC | PRN
  Administered 2015-03-13: 125 ug/kg/min via INTRAVENOUS

## 2015-03-13 SURGICAL SUPPLY — 22 items

## 2015-03-13 NOTE — Transfer of Care (Signed)
Immediate Anesthesia Transfer of Care Note  Patient: Christy Stanley  Procedure(s) Performed: Procedure(s): COLONOSCOPY WITH PROPOFOL (N/A)  Patient Location: PACU and Endoscopy Unit  Anesthesia Type:MAC  Level of Consciousness: awake, alert , oriented and patient cooperative  Airway & Oxygen Therapy: Patient Spontanous Breathing and Patient connected to face mask oxygen  Post-op Assessment: Report given to RN and Post -op Vital signs reviewed and stable  Post vital signs: Reviewed and stable  Last Vitals:  Filed Vitals:   03/13/15 0723  BP: 128/72  Pulse: 79  Temp: 36.8 C  Resp: 14    Complications: No apparent anesthesia complications

## 2015-03-13 NOTE — Anesthesia Preprocedure Evaluation (Signed)
Anesthesia Evaluation  Patient identified by MRN, date of birth, ID band Patient awake    Reviewed: Allergy & Precautions, H&P , NPO status , Patient's Chart, lab work & pertinent test results  Airway Mallampati: II  TM Distance: >3 FB Neck ROM: full    Dental no notable dental hx. (+) Dental Advisory Given, Teeth Intact   Pulmonary neg pulmonary ROS,    Pulmonary exam normal breath sounds clear to auscultation       Cardiovascular Exercise Tolerance: Good negative cardio ROS Normal cardiovascular exam Rhythm:regular Rate:Normal     Neuro/Psych negative neurological ROS  negative psych ROS   GI/Hepatic negative GI ROS, Neg liver ROS,   Endo/Other  negative endocrine ROS  Renal/GU negative Renal ROS  negative genitourinary   Musculoskeletal   Abdominal   Peds  Hematology negative hematology ROS (+)   Anesthesia Other Findings   Reproductive/Obstetrics negative OB ROS                             Anesthesia Physical Anesthesia Plan  ASA: I  Anesthesia Plan: MAC   Post-op Pain Management:    Induction:   Airway Management Planned:   Additional Equipment:   Intra-op Plan:   Post-operative Plan:   Informed Consent: I have reviewed the patients History and Physical, chart, labs and discussed the procedure including the risks, benefits and alternatives for the proposed anesthesia with the patient or authorized representative who has indicated his/her understanding and acceptance.   Dental Advisory Given  Plan Discussed with: CRNA  Anesthesia Plan Comments:         Anesthesia Quick Evaluation

## 2015-03-13 NOTE — Discharge Instructions (Signed)
Colonoscopy, Care After °Refer to this sheet in the next few weeks. These instructions provide you with information on caring for yourself after your procedure. Your health care provider may also give you more specific instructions. Your treatment has been planned according to current medical practices, but problems sometimes occur. Call your health care provider if you have any problems or questions after your procedure. °WHAT TO EXPECT AFTER THE PROCEDURE  °After your procedure, it is typical to have the following: °· A small amount of blood in your stool. °· Moderate amounts of gas and mild abdominal cramping or bloating. °HOME CARE INSTRUCTIONS °· Do not drive, operate machinery, or sign important documents for 24 hours. °· You may shower and resume your regular physical activities, but move at a slower pace for the first 24 hours. °· Take frequent rest periods for the first 24 hours. °· Walk around or put a warm pack on your abdomen to help reduce abdominal cramping and bloating. °· Drink enough fluids to keep your urine clear or pale yellow. °· You may resume your normal diet as instructed by your health care provider. Avoid heavy or fried foods that are hard to digest. °· Avoid drinking alcohol for 24 hours or as instructed by your health care provider. °· Only take over-the-counter or prescription medicines as directed by your health care provider. °· If a tissue sample (biopsy) was taken during your procedure: °¨ Do not take aspirin or blood thinners for 7 days, or as instructed by your health care provider. °¨ Do not drink alcohol for 7 days, or as instructed by your health care provider. °¨ Eat soft foods for the first 24 hours. °SEEK MEDICAL CARE IF: °You have persistent spotting of blood in your stool 2-3 days after the procedure. °SEEK IMMEDIATE MEDICAL CARE IF: °· You have more than a small spotting of blood in your stool. °· You pass large blood clots in your stool. °· Your abdomen is swollen  (distended). °· You have nausea or vomiting. °· You have a fever. °· You have increasing abdominal pain that is not relieved with medicine. °  °This information is not intended to replace advice given to you by your health care provider. Make sure you discuss any questions you have with your health care provider. °  °Document Released: 08/29/2003 Document Revised: 11/04/2012 Document Reviewed: 09/21/2012 °Elsevier Interactive Patient Education ©2016 [employer] ° °

## 2015-03-13 NOTE — H&P (Signed)
  Procedure: Screening colonoscopy. 01/08/2005 normal screening colonoscopy and diagnostic esophagogastroduodenoscopy performed.  History: The patient is a 60 year old female born 1955/06/21. She is scheduled to undergo a repeat screening colonoscopy today  Past medical history: Irritable bowel syndrome. Arthroscopic knee surgery. Neck surgery. Tubal ligation. Hysterectomy to treat fibroids. Cervical laminectomy.  Medication allergies: Sulfa and morphine  Exam: Patient is alert and lying comfortably on the endoscopy stretcher. Abdomen is soft and nontender to palpation. Lungs are clear to auscultation. Cardiac exam reveals a regular rhythm.  Plan: Proceed with screening colonoscopy

## 2015-03-13 NOTE — Anesthesia Postprocedure Evaluation (Signed)
Anesthesia Post Note  Patient: Christy Stanley  Procedure(s) Performed: Procedure(s) (LRB): COLONOSCOPY WITH PROPOFOL (N/A)  Patient location during evaluation: PACU Anesthesia Type: MAC Level of consciousness: awake and alert Pain management: pain level controlled Vital Signs Assessment: post-procedure vital signs reviewed and stable Respiratory status: spontaneous breathing, nonlabored ventilation, respiratory function stable and patient connected to nasal cannula oxygen Cardiovascular status: blood pressure returned to baseline and stable Postop Assessment: no signs of nausea or vomiting Anesthetic complications: no    Last Vitals:  Filed Vitals:   03/13/15 0940 03/13/15 0946  BP: 121/73 131/74  Pulse: 72 74  Temp:    Resp: 14 13    Last Pain: There were no vitals filed for this visit.               Aleeha Boline L

## 2015-03-13 NOTE — Op Note (Signed)
Procedure: Screening colonoscopy. Normal screening colonoscopy performed on 01/08/2005  Endoscopist: Danise Edge  Premedication: Propofol administered by anesthesia  Procedure: The patient was placed in the left lateral decubitus position. Anal inspection and digital rectal exam were normal. The Pentax pediatric colonoscope was introduced into the rectum and advanced to the cecum. A normal-appearing appendiceal orifice and ileocecal valve were identified. Colonic preparation for the exam today was good. Withdrawal time was 9 minutes  Rectum. Normal. Retroflexed view of the distal rectum was normal  Sigmoid colon and descending colon. Colonic diverticulosis  Splenic flexure. Normal  Transverse colon. Normal  Hepatic flexure. Normal  Ascending colon. Normal  Cecum and ileocecal valve. Normal  Assessment: Normal screening colonoscopy  Recommendation: Repeat screening colonoscopy in 10 years

## 2015-03-14 ENCOUNTER — Encounter (HOSPITAL_COMMUNITY): Payer: Self-pay | Admitting: Gastroenterology

## 2015-06-05 DIAGNOSIS — M24122 Other articular cartilage disorders, left elbow: Secondary | ICD-10-CM | POA: Diagnosis not present

## 2015-06-05 DIAGNOSIS — M7712 Lateral epicondylitis, left elbow: Secondary | ICD-10-CM | POA: Diagnosis not present

## 2015-06-19 DIAGNOSIS — M7712 Lateral epicondylitis, left elbow: Secondary | ICD-10-CM | POA: Diagnosis not present

## 2015-07-03 DIAGNOSIS — M7712 Lateral epicondylitis, left elbow: Secondary | ICD-10-CM | POA: Diagnosis not present

## 2015-07-18 DIAGNOSIS — M7712 Lateral epicondylitis, left elbow: Secondary | ICD-10-CM | POA: Diagnosis not present

## 2015-07-27 DIAGNOSIS — M7712 Lateral epicondylitis, left elbow: Secondary | ICD-10-CM | POA: Diagnosis not present

## 2015-08-03 DIAGNOSIS — M7712 Lateral epicondylitis, left elbow: Secondary | ICD-10-CM | POA: Diagnosis not present

## 2015-08-10 DIAGNOSIS — M7712 Lateral epicondylitis, left elbow: Secondary | ICD-10-CM | POA: Diagnosis not present

## 2015-08-11 DIAGNOSIS — Z0000 Encounter for general adult medical examination without abnormal findings: Secondary | ICD-10-CM | POA: Diagnosis not present

## 2015-08-31 DIAGNOSIS — N3000 Acute cystitis without hematuria: Secondary | ICD-10-CM | POA: Diagnosis not present

## 2015-08-31 DIAGNOSIS — R350 Frequency of micturition: Secondary | ICD-10-CM | POA: Diagnosis not present

## 2015-09-12 DIAGNOSIS — Z4789 Encounter for other orthopedic aftercare: Secondary | ICD-10-CM | POA: Diagnosis not present

## 2015-09-12 DIAGNOSIS — M7712 Lateral epicondylitis, left elbow: Secondary | ICD-10-CM | POA: Diagnosis not present

## 2015-09-12 DIAGNOSIS — M1811 Unilateral primary osteoarthritis of first carpometacarpal joint, right hand: Secondary | ICD-10-CM | POA: Diagnosis not present

## 2015-09-18 DIAGNOSIS — M8588 Other specified disorders of bone density and structure, other site: Secondary | ICD-10-CM | POA: Diagnosis not present

## 2015-11-10 DIAGNOSIS — Z23 Encounter for immunization: Secondary | ICD-10-CM | POA: Diagnosis not present

## 2015-11-27 DIAGNOSIS — D225 Melanocytic nevi of trunk: Secondary | ICD-10-CM | POA: Diagnosis not present

## 2015-11-27 DIAGNOSIS — Z1283 Encounter for screening for malignant neoplasm of skin: Secondary | ICD-10-CM | POA: Diagnosis not present

## 2015-11-27 DIAGNOSIS — Z1231 Encounter for screening mammogram for malignant neoplasm of breast: Secondary | ICD-10-CM | POA: Diagnosis not present

## 2015-11-27 DIAGNOSIS — B078 Other viral warts: Secondary | ICD-10-CM | POA: Diagnosis not present

## 2016-10-29 DIAGNOSIS — M1811 Unilateral primary osteoarthritis of first carpometacarpal joint, right hand: Secondary | ICD-10-CM | POA: Diagnosis not present

## 2016-11-08 DIAGNOSIS — M1811 Unilateral primary osteoarthritis of first carpometacarpal joint, right hand: Secondary | ICD-10-CM | POA: Diagnosis not present

## 2016-11-20 DIAGNOSIS — M1811 Unilateral primary osteoarthritis of first carpometacarpal joint, right hand: Secondary | ICD-10-CM | POA: Diagnosis not present

## 2016-11-25 DIAGNOSIS — B078 Other viral warts: Secondary | ICD-10-CM | POA: Diagnosis not present

## 2016-11-25 DIAGNOSIS — L820 Inflamed seborrheic keratosis: Secondary | ICD-10-CM | POA: Diagnosis not present

## 2016-11-26 DIAGNOSIS — M1811 Unilateral primary osteoarthritis of first carpometacarpal joint, right hand: Secondary | ICD-10-CM | POA: Diagnosis not present

## 2016-11-27 DIAGNOSIS — Z1231 Encounter for screening mammogram for malignant neoplasm of breast: Secondary | ICD-10-CM | POA: Diagnosis not present

## 2016-12-04 DIAGNOSIS — M1811 Unilateral primary osteoarthritis of first carpometacarpal joint, right hand: Secondary | ICD-10-CM | POA: Diagnosis not present

## 2016-12-11 DIAGNOSIS — M1811 Unilateral primary osteoarthritis of first carpometacarpal joint, right hand: Secondary | ICD-10-CM | POA: Diagnosis not present

## 2016-12-23 DIAGNOSIS — M1811 Unilateral primary osteoarthritis of first carpometacarpal joint, right hand: Secondary | ICD-10-CM | POA: Diagnosis not present

## 2016-12-24 DIAGNOSIS — M1811 Unilateral primary osteoarthritis of first carpometacarpal joint, right hand: Secondary | ICD-10-CM | POA: Diagnosis not present

## 2017-02-06 DIAGNOSIS — M1811 Unilateral primary osteoarthritis of first carpometacarpal joint, right hand: Secondary | ICD-10-CM | POA: Diagnosis not present

## 2017-03-03 DIAGNOSIS — M7541 Impingement syndrome of right shoulder: Secondary | ICD-10-CM | POA: Diagnosis not present

## 2017-03-03 DIAGNOSIS — M25511 Pain in right shoulder: Secondary | ICD-10-CM | POA: Diagnosis not present

## 2017-03-17 DIAGNOSIS — F419 Anxiety disorder, unspecified: Secondary | ICD-10-CM | POA: Diagnosis not present

## 2017-03-17 DIAGNOSIS — G4700 Insomnia, unspecified: Secondary | ICD-10-CM | POA: Diagnosis not present

## 2017-04-03 DIAGNOSIS — M7541 Impingement syndrome of right shoulder: Secondary | ICD-10-CM | POA: Diagnosis not present

## 2017-08-14 DIAGNOSIS — Z0000 Encounter for general adult medical examination without abnormal findings: Secondary | ICD-10-CM | POA: Diagnosis not present

## 2017-08-14 DIAGNOSIS — F419 Anxiety disorder, unspecified: Secondary | ICD-10-CM | POA: Diagnosis not present

## 2017-08-14 DIAGNOSIS — Z13220 Encounter for screening for lipoid disorders: Secondary | ICD-10-CM | POA: Diagnosis not present

## 2017-09-16 DIAGNOSIS — M1811 Unilateral primary osteoarthritis of first carpometacarpal joint, right hand: Secondary | ICD-10-CM | POA: Diagnosis not present

## 2017-09-16 DIAGNOSIS — M65331 Trigger finger, right middle finger: Secondary | ICD-10-CM | POA: Diagnosis not present

## 2017-09-16 DIAGNOSIS — M65332 Trigger finger, left middle finger: Secondary | ICD-10-CM | POA: Diagnosis not present

## 2017-09-22 DIAGNOSIS — H5213 Myopia, bilateral: Secondary | ICD-10-CM | POA: Diagnosis not present

## 2017-09-22 DIAGNOSIS — H2513 Age-related nuclear cataract, bilateral: Secondary | ICD-10-CM | POA: Diagnosis not present

## 2017-10-21 DIAGNOSIS — M65332 Trigger finger, left middle finger: Secondary | ICD-10-CM | POA: Diagnosis not present

## 2017-12-01 DIAGNOSIS — Z1231 Encounter for screening mammogram for malignant neoplasm of breast: Secondary | ICD-10-CM | POA: Diagnosis not present

## 2017-12-02 DIAGNOSIS — Z5189 Encounter for other specified aftercare: Secondary | ICD-10-CM | POA: Diagnosis not present

## 2017-12-02 DIAGNOSIS — M65332 Trigger finger, left middle finger: Secondary | ICD-10-CM | POA: Diagnosis not present

## 2017-12-02 DIAGNOSIS — M65311 Trigger thumb, right thumb: Secondary | ICD-10-CM | POA: Diagnosis not present

## 2017-12-30 DIAGNOSIS — Z1159 Encounter for screening for other viral diseases: Secondary | ICD-10-CM | POA: Diagnosis not present

## 2017-12-30 DIAGNOSIS — G4700 Insomnia, unspecified: Secondary | ICD-10-CM | POA: Diagnosis not present

## 2017-12-30 DIAGNOSIS — F419 Anxiety disorder, unspecified: Secondary | ICD-10-CM | POA: Diagnosis not present

## 2018-02-11 DIAGNOSIS — E7800 Pure hypercholesterolemia, unspecified: Secondary | ICD-10-CM | POA: Diagnosis not present

## 2018-02-11 DIAGNOSIS — Z1159 Encounter for screening for other viral diseases: Secondary | ICD-10-CM | POA: Diagnosis not present

## 2018-02-12 DIAGNOSIS — M25561 Pain in right knee: Secondary | ICD-10-CM | POA: Diagnosis not present

## 2018-02-12 DIAGNOSIS — M238X1 Other internal derangements of right knee: Secondary | ICD-10-CM | POA: Diagnosis not present

## 2018-02-18 DIAGNOSIS — E7800 Pure hypercholesterolemia, unspecified: Secondary | ICD-10-CM | POA: Diagnosis not present

## 2018-03-03 DIAGNOSIS — M65332 Trigger finger, left middle finger: Secondary | ICD-10-CM | POA: Diagnosis not present

## 2018-03-03 DIAGNOSIS — M65311 Trigger thumb, right thumb: Secondary | ICD-10-CM | POA: Diagnosis not present

## 2018-03-31 DIAGNOSIS — M1711 Unilateral primary osteoarthritis, right knee: Secondary | ICD-10-CM | POA: Diagnosis not present

## 2018-04-07 DIAGNOSIS — M65311 Trigger thumb, right thumb: Secondary | ICD-10-CM | POA: Diagnosis not present

## 2018-04-08 DIAGNOSIS — M25561 Pain in right knee: Secondary | ICD-10-CM | POA: Diagnosis not present

## 2018-04-08 DIAGNOSIS — S83241A Other tear of medial meniscus, current injury, right knee, initial encounter: Secondary | ICD-10-CM | POA: Diagnosis not present

## 2018-04-08 DIAGNOSIS — M1711 Unilateral primary osteoarthritis, right knee: Secondary | ICD-10-CM | POA: Diagnosis not present

## 2018-04-20 DIAGNOSIS — E7800 Pure hypercholesterolemia, unspecified: Secondary | ICD-10-CM | POA: Diagnosis not present

## 2018-06-29 DIAGNOSIS — M23221 Derangement of posterior horn of medial meniscus due to old tear or injury, right knee: Secondary | ICD-10-CM | POA: Diagnosis not present

## 2018-08-07 ENCOUNTER — Other Ambulatory Visit: Payer: Self-pay

## 2018-08-07 ENCOUNTER — Ambulatory Visit
Admission: EM | Admit: 2018-08-07 | Discharge: 2018-08-07 | Disposition: A | Discharge status: Home / Self Care | Payer: BC Managed Care – PPO | Attending: Emergency Medicine | Admitting: Emergency Medicine

## 2018-08-07 ENCOUNTER — Other Ambulatory Visit: Payer: BC Managed Care – PPO

## 2018-08-07 ENCOUNTER — Telehealth: Payer: Self-pay | Admitting: *Deleted

## 2018-08-07 DIAGNOSIS — Z20822 Contact with and (suspected) exposure to covid-19: Secondary | ICD-10-CM

## 2018-08-07 DIAGNOSIS — R6889 Other general symptoms and signs: Secondary | ICD-10-CM | POA: Diagnosis not present

## 2018-08-07 DIAGNOSIS — J029 Acute pharyngitis, unspecified: Secondary | ICD-10-CM | POA: Diagnosis not present

## 2018-08-07 MED ORDER — AMOXICILLIN 500 MG PO CAPS: 500.0000 mg | ORAL_CAPSULE | Freq: Two times a day (BID) | ORAL | 0 refills | Status: AC

## 2018-08-07 NOTE — ED Provider Notes (Signed)
[facility]   [id number] 08/07/18 Arrival Time: 1328  CC: SORE THROAT  SUBJECTIVE: History from: patient.  Christy Stanley is a 63 y.o. female hx significant for allergies, anxiety and depression, who presents with sore throat, and bilateral ear pain x 5 days.  Denies sick exposure to COVID, flu or strep.  Denies recent travel.  Has tried OTC advil with minimal relief.  Symptoms are made worse with swallowing, but tolerating liquids and own secretions without difficulty.  Reports previous symptoms in the past with strep throat.  Complains of mild HA, and fatigue.   Denies fever, chills, sinus pain, rhinorrhea, nasal congestion, cough, SOB, wheezing, chest pain, nausea, rash, changes in bowel or bladder habits.     ROS: As per HPI.  Past Medical History:  Diagnosis Date  . Allergy   . Anxiety   . Depression   . Elbow arthritis    tendionitis, wears brace   Past Surgical History:  Procedure Laterality Date  . ABDOMINAL HYSTERECTOMY     partial  . CESAREAN SECTION     x1  . COLONOSCOPY WITH PROPOFOL N/A 03/13/2015   Procedure: COLONOSCOPY WITH PROPOFOL;  Surgeon: Charolett BumpersMartin K Johnson, MD;  Location: [facility];  Service: Endoscopy;  Laterality: N/A;  . NECK DISSECTION     x 2, vertebrae fixed  . TUBAL LIGATION     Allergies  Allergen Reactions  . Morphine And Related Itching  . Sulfur Itching and Rash   No current facility-administered medications on file prior to encounter.    Current Outpatient Medications on File Prior to Encounter  Medication Sig Dispense Refill  . sertraline (ZOLOFT) 100 MG tablet Take 100 mg by mouth daily.    . [DISCONTINUED] fluticasone (FLONASE) 50 MCG/ACT nasal spray Place 1 spray into both nostrils as needed.     . [DISCONTINUED] PREMARIN 0.45 MG tablet Take 0.45 mg by mouth daily.     . [DISCONTINUED] zolpidem (AMBIEN) 10 MG tablet Take 10 mg by mouth at bedtime as needed for sleep.   5   Social History   Socioeconomic History  .  Marital status: Married    Spouse name: Not on file  . Number of children: Not on file  . Years of education: Not on file  . Highest education level: Not on file  Occupational History  . Not on file  Social Needs  . Financial resource strain: Not on file  . Food insecurity    Worry: Not on file    Inability: Not on file  . Transportation needs    Medical: Not on file    Non-medical: Not on file  Tobacco Use  . Smoking status: Never Smoker  . Smokeless tobacco: Never Used  Substance and Sexual Activity  . Alcohol use: No  . Drug use: No  . Sexual activity: Not on file  Lifestyle  . Physical activity    Days per week: Not on file    Minutes per session: Not on file  . Stress: Not on file  Relationships  . Social Musicianconnections    Talks on phone: Not on file    Gets together: Not on file    Attends religious service: Not on file    Active member of club or organization: Not on file    Attends meetings of clubs or organizations: Not on file    Relationship status: Not on file  . Intimate partner violence    Fear of current or ex partner: Not on  file    Emotionally abused: Not on file    Physically abused: Not on file    Forced sexual activity: Not on file  Other Topics Concern  . Not on file  Social History Narrative  . Not on file   Family History  Problem Relation Age of Onset  . Heart disease Mother   . Hepatitis Mother   . Cirrhosis Mother   . Prostate cancer Father   . Cancer Father     OBJECTIVE:  Vitals:   08/07/18 1331  BP: (!) 149/88  Pulse: 80  Resp: 19  Temp: 98.8 F (37.1 C)  SpO2: 95%     General appearance: alert; appears mildly fatigued, but nontoxic, speaking in full sentences and managing own secretions HEENT: NCAT; Ears: EACs clear, TMs pearly gray with visible cone of light, without erythema; Eyes: PERRL, EOMI grossly; Nose: no obvious rhinorrhea; Throat: oropharynx clear, tonsils 1+ and erythematous without white tonsillar exudates,  uvula midline Neck: supple without LAD Lungs: CTA bilaterally without adventitious breath sounds; cough absent Heart: regular rate and rhythm.   Skin: warm and dry Psychological: alert and cooperative; normal mood and affect  LABS: No results found for this or any previous visit (from the past 24 hour(s)).   ASSESSMENT & PLAN:  1. Acute pharyngitis, unspecified etiology   2. Suspected Covid-19 Virus Infection     Meds ordered this encounter  Medications  . amoxicillin (AMOXIL) 500 MG capsule    Sig: Take 1 capsule (500 mg total) by mouth 2 (two) times daily for 10 days.    Dispense:  20 capsule    Refill:  0    Order Specific Question:   Supervising Provider    Answer:   Raylene Everts [[id number]]   Strep test negative, will send out for culture and we will call you with results However, based on physical exam and symptoms we will go ahead and treat for possible strep infection as well test for COVID Prescribed amoxicillin 500mg  twice daily for 10 days.  Take as directed and to completion.  Drink warm or cool liquids, use throat lozenges, or popsicles to help alleviate symptoms Take OTC ibuprofen or tylenol as needed for pain  COVID testing ordered.  Outpatient center will contact you regarding your appointment In the meantime: You should remain isolated in your home for 7 days from symptom onset AND greater than 72 hours after symptoms resolution (absence of fever without the use of fever-reducing medication and improvement in respiratory symptoms), whichever is longer Call or go to the ED if you have any new or worsening symptoms such as fever, worsening cough, shortness of breath, chest tightness, chest pain, turning blue, changes in mental status, etc...  Reviewed expectations re: course of current medical issues. Questions answered. Outlined signs and symptoms indicating need for more acute intervention. Patient verbalized understanding. After Visit Summary given.         Lestine Box, PA-C 08/07/18 1358

## 2018-08-07 NOTE — Discharge Instructions (Signed)
Strep test negative, will send out for culture and we will call you with results However, based on physical exam and symptoms we will go ahead and treat for possible strep infection as well test for COVID Prescribed amoxicillin 500mg  twice daily for 10 days.  Take as directed and to completion.  Drink warm or cool liquids, use throat lozenges, or popsicles to help alleviate symptoms Take OTC ibuprofen or tylenol as needed for pain  COVID testing ordered.  Outpatient center will contact you regarding your appointment In the meantime: You should remain isolated in your home for 7 days from symptom onset AND greater than 72 hours after symptoms resolution (absence of fever without the use of fever-reducing medication and improvement in respiratory symptoms), whichever is longer Call or go to the ED if you have any new or worsening symptoms such as fever, worsening cough, shortness of breath, chest tightness, chest pain, turning blue, changes in mental status, etc..Marland Kitchen

## 2018-08-07 NOTE — Telephone Encounter (Signed)
-----   Message from [place], [place] sent at 08/07/2018  1:54 PM EDT ----- Regarding: COVID testing Bilateral ear pain, sore throat, fatigue, and HA x 5 days.  Negative rapid strep in office.  No known exposure to COVID or recent travel.

## 2018-08-07 NOTE — ED Triage Notes (Signed)
Pt presents with complaints of sore throat, headache and bilateral ear pain since Monday. Denies any fever or cough.

## 2018-08-07 NOTE — Telephone Encounter (Signed)
Scheduled patient for COVID 19 test today at 3:45pm at [facility].  Testing protocol reviewed with patient.

## 2018-08-09 LAB — CULTURE, GROUP A STREP (THRC)

## 2018-08-11 LAB — NOVEL CORONAVIRUS, NAA: SARS-CoV-2, NAA: NOT DETECTED

## 2018-08-17 ENCOUNTER — Encounter (HOSPITAL_COMMUNITY): Payer: Self-pay

## 2018-09-24 DIAGNOSIS — R309 Painful micturition, unspecified: Secondary | ICD-10-CM | POA: Diagnosis not present

## 2018-10-12 DIAGNOSIS — N390 Urinary tract infection, site not specified: Secondary | ICD-10-CM | POA: Diagnosis not present

## 2018-11-18 DIAGNOSIS — E7800 Pure hypercholesterolemia, unspecified: Secondary | ICD-10-CM | POA: Diagnosis not present

## 2018-11-18 DIAGNOSIS — Z0000 Encounter for general adult medical examination without abnormal findings: Secondary | ICD-10-CM | POA: Diagnosis not present

## 2018-12-07 DIAGNOSIS — Z1231 Encounter for screening mammogram for malignant neoplasm of breast: Secondary | ICD-10-CM | POA: Diagnosis not present

## 2018-12-30 DIAGNOSIS — H43811 Vitreous degeneration, right eye: Secondary | ICD-10-CM | POA: Diagnosis not present

## 2019-02-20 DIAGNOSIS — R69 Illness, unspecified: Secondary | ICD-10-CM | POA: Diagnosis not present

## 2019-02-20 DIAGNOSIS — Z882 Allergy status to sulfonamides status: Secondary | ICD-10-CM | POA: Diagnosis not present

## 2019-02-20 DIAGNOSIS — E785 Hyperlipidemia, unspecified: Secondary | ICD-10-CM | POA: Diagnosis not present

## 2019-02-20 DIAGNOSIS — G4700 Insomnia, unspecified: Secondary | ICD-10-CM | POA: Diagnosis not present

## 2019-03-01 DIAGNOSIS — H5213 Myopia, bilateral: Secondary | ICD-10-CM | POA: Diagnosis not present

## 2019-03-01 DIAGNOSIS — H43811 Vitreous degeneration, right eye: Secondary | ICD-10-CM | POA: Diagnosis not present

## 2019-03-01 DIAGNOSIS — H2513 Age-related nuclear cataract, bilateral: Secondary | ICD-10-CM | POA: Diagnosis not present

## 2019-03-10 DIAGNOSIS — Z0100 Encounter for examination of eyes and vision without abnormal findings: Secondary | ICD-10-CM | POA: Diagnosis not present

## 2019-04-02 DIAGNOSIS — M65331 Trigger finger, right middle finger: Secondary | ICD-10-CM | POA: Diagnosis not present

## 2019-05-13 DIAGNOSIS — R69 Illness, unspecified: Secondary | ICD-10-CM | POA: Diagnosis not present

## 2019-05-20 DIAGNOSIS — G4700 Insomnia, unspecified: Secondary | ICD-10-CM | POA: Diagnosis not present

## 2019-05-20 DIAGNOSIS — R69 Illness, unspecified: Secondary | ICD-10-CM | POA: Diagnosis not present

## 2019-05-20 DIAGNOSIS — E7800 Pure hypercholesterolemia, unspecified: Secondary | ICD-10-CM | POA: Diagnosis not present

## 2019-07-01 DIAGNOSIS — M65331 Trigger finger, right middle finger: Secondary | ICD-10-CM | POA: Diagnosis not present

## 2019-07-14 DIAGNOSIS — L820 Inflamed seborrheic keratosis: Secondary | ICD-10-CM | POA: Diagnosis not present

## 2019-09-23 DIAGNOSIS — M65331 Trigger finger, right middle finger: Secondary | ICD-10-CM | POA: Diagnosis not present

## 2019-10-08 DIAGNOSIS — M65331 Trigger finger, right middle finger: Secondary | ICD-10-CM | POA: Diagnosis not present

## 2019-11-17 DIAGNOSIS — R69 Illness, unspecified: Secondary | ICD-10-CM | POA: Diagnosis not present

## 2019-11-18 DIAGNOSIS — R69 Illness, unspecified: Secondary | ICD-10-CM | POA: Diagnosis not present

## 2019-12-06 DIAGNOSIS — Z0000 Encounter for general adult medical examination without abnormal findings: Secondary | ICD-10-CM | POA: Diagnosis not present

## 2019-12-06 DIAGNOSIS — Z23 Encounter for immunization: Secondary | ICD-10-CM | POA: Diagnosis not present

## 2019-12-06 DIAGNOSIS — E7800 Pure hypercholesterolemia, unspecified: Secondary | ICD-10-CM | POA: Diagnosis not present

## 2019-12-06 DIAGNOSIS — G4700 Insomnia, unspecified: Secondary | ICD-10-CM | POA: Diagnosis not present

## 2019-12-06 DIAGNOSIS — Z1231 Encounter for screening mammogram for malignant neoplasm of breast: Secondary | ICD-10-CM | POA: Diagnosis not present

## 2019-12-06 DIAGNOSIS — Z1211 Encounter for screening for malignant neoplasm of colon: Secondary | ICD-10-CM | POA: Diagnosis not present

## 2019-12-06 DIAGNOSIS — K589 Irritable bowel syndrome without diarrhea: Secondary | ICD-10-CM | POA: Diagnosis not present

## 2019-12-06 DIAGNOSIS — R69 Illness, unspecified: Secondary | ICD-10-CM | POA: Diagnosis not present

## 2019-12-13 DIAGNOSIS — Z1231 Encounter for screening mammogram for malignant neoplasm of breast: Secondary | ICD-10-CM | POA: Diagnosis not present

## 2020-01-12 DIAGNOSIS — H43812 Vitreous degeneration, left eye: Secondary | ICD-10-CM | POA: Diagnosis not present

## 2020-01-17 DIAGNOSIS — M25512 Pain in left shoulder: Secondary | ICD-10-CM | POA: Diagnosis not present

## 2020-02-15 DIAGNOSIS — H43812 Vitreous degeneration, left eye: Secondary | ICD-10-CM | POA: Diagnosis not present

## 2020-04-13 DIAGNOSIS — H524 Presbyopia: Secondary | ICD-10-CM | POA: Diagnosis not present

## 2020-04-13 DIAGNOSIS — H2513 Age-related nuclear cataract, bilateral: Secondary | ICD-10-CM | POA: Diagnosis not present

## 2020-04-13 DIAGNOSIS — H43812 Vitreous degeneration, left eye: Secondary | ICD-10-CM | POA: Diagnosis not present

## 2020-05-08 DIAGNOSIS — G4700 Insomnia, unspecified: Secondary | ICD-10-CM | POA: Diagnosis not present

## 2020-05-08 DIAGNOSIS — R69 Illness, unspecified: Secondary | ICD-10-CM | POA: Diagnosis not present

## 2020-05-10 DIAGNOSIS — M7542 Impingement syndrome of left shoulder: Secondary | ICD-10-CM | POA: Diagnosis not present

## 2020-08-01 DIAGNOSIS — M25562 Pain in left knee: Secondary | ICD-10-CM | POA: Diagnosis not present

## 2020-09-28 DIAGNOSIS — G4700 Insomnia, unspecified: Secondary | ICD-10-CM | POA: Diagnosis not present

## 2020-09-28 DIAGNOSIS — E785 Hyperlipidemia, unspecified: Secondary | ICD-10-CM | POA: Diagnosis not present

## 2020-09-28 DIAGNOSIS — R32 Unspecified urinary incontinence: Secondary | ICD-10-CM | POA: Diagnosis not present

## 2020-09-28 DIAGNOSIS — K589 Irritable bowel syndrome without diarrhea: Secondary | ICD-10-CM | POA: Diagnosis not present

## 2020-09-28 DIAGNOSIS — Z7722 Contact with and (suspected) exposure to environmental tobacco smoke (acute) (chronic): Secondary | ICD-10-CM | POA: Diagnosis not present

## 2020-09-28 DIAGNOSIS — Z8042 Family history of malignant neoplasm of prostate: Secondary | ICD-10-CM | POA: Diagnosis not present

## 2020-09-28 DIAGNOSIS — R030 Elevated blood-pressure reading, without diagnosis of hypertension: Secondary | ICD-10-CM | POA: Diagnosis not present

## 2020-09-28 DIAGNOSIS — R69 Illness, unspecified: Secondary | ICD-10-CM | POA: Diagnosis not present

## 2020-09-28 DIAGNOSIS — I499 Cardiac arrhythmia, unspecified: Secondary | ICD-10-CM | POA: Diagnosis not present

## 2020-09-28 DIAGNOSIS — M1990 Unspecified osteoarthritis, unspecified site: Secondary | ICD-10-CM | POA: Diagnosis not present

## 2020-12-11 DIAGNOSIS — G4700 Insomnia, unspecified: Secondary | ICD-10-CM | POA: Diagnosis not present

## 2020-12-11 DIAGNOSIS — E7800 Pure hypercholesterolemia, unspecified: Secondary | ICD-10-CM | POA: Diagnosis not present

## 2020-12-11 DIAGNOSIS — Z0000 Encounter for general adult medical examination without abnormal findings: Secondary | ICD-10-CM | POA: Diagnosis not present

## 2020-12-11 DIAGNOSIS — Z1389 Encounter for screening for other disorder: Secondary | ICD-10-CM | POA: Diagnosis not present

## 2020-12-11 DIAGNOSIS — R69 Illness, unspecified: Secondary | ICD-10-CM | POA: Diagnosis not present

## 2020-12-11 DIAGNOSIS — E2839 Other primary ovarian failure: Secondary | ICD-10-CM | POA: Diagnosis not present

## 2020-12-11 DIAGNOSIS — Z79899 Other long term (current) drug therapy: Secondary | ICD-10-CM | POA: Diagnosis not present

## 2020-12-18 DIAGNOSIS — Z1231 Encounter for screening mammogram for malignant neoplasm of breast: Secondary | ICD-10-CM | POA: Diagnosis not present

## 2020-12-18 DIAGNOSIS — M85852 Other specified disorders of bone density and structure, left thigh: Secondary | ICD-10-CM | POA: Diagnosis not present

## 2020-12-18 DIAGNOSIS — Z780 Asymptomatic menopausal state: Secondary | ICD-10-CM | POA: Diagnosis not present

## 2020-12-18 DIAGNOSIS — M85851 Other specified disorders of bone density and structure, right thigh: Secondary | ICD-10-CM | POA: Diagnosis not present

## 2020-12-27 DIAGNOSIS — M25511 Pain in right shoulder: Secondary | ICD-10-CM | POA: Diagnosis not present

## 2021-01-09 DIAGNOSIS — M25511 Pain in right shoulder: Secondary | ICD-10-CM | POA: Diagnosis not present

## 2021-01-25 DIAGNOSIS — M7541 Impingement syndrome of right shoulder: Secondary | ICD-10-CM | POA: Diagnosis not present

## 2021-01-25 DIAGNOSIS — M25511 Pain in right shoulder: Secondary | ICD-10-CM | POA: Diagnosis not present

## 2021-03-26 DIAGNOSIS — B078 Other viral warts: Secondary | ICD-10-CM | POA: Diagnosis not present

## 2021-03-26 DIAGNOSIS — L258 Unspecified contact dermatitis due to other agents: Secondary | ICD-10-CM | POA: Diagnosis not present

## 2021-03-27 ENCOUNTER — Ambulatory Visit (HOSPITAL_COMMUNITY): Payer: Medicare HMO | Admitting: Occupational Therapy

## 2021-03-29 ENCOUNTER — Ambulatory Visit (HOSPITAL_COMMUNITY): Payer: Medicare HMO

## 2021-04-03 ENCOUNTER — Encounter (HOSPITAL_COMMUNITY): Payer: Medicare HMO | Admitting: Occupational Therapy

## 2021-04-05 ENCOUNTER — Encounter (HOSPITAL_COMMUNITY): Payer: Medicare HMO | Admitting: Occupational Therapy

## 2021-04-10 ENCOUNTER — Encounter (HOSPITAL_COMMUNITY): Payer: Medicare HMO | Admitting: Occupational Therapy

## 2021-04-12 ENCOUNTER — Encounter (HOSPITAL_COMMUNITY): Payer: Medicare HMO | Admitting: Occupational Therapy

## 2021-04-17 ENCOUNTER — Encounter (HOSPITAL_COMMUNITY): Payer: Medicare HMO | Admitting: Occupational Therapy

## 2021-04-19 ENCOUNTER — Ambulatory Visit (HOSPITAL_COMMUNITY): Payer: Medicare HMO | Admitting: Occupational Therapy

## 2021-04-24 ENCOUNTER — Encounter (HOSPITAL_COMMUNITY): Payer: Medicare HMO | Admitting: Occupational Therapy

## 2021-04-26 ENCOUNTER — Encounter (HOSPITAL_COMMUNITY): Payer: Medicare HMO | Admitting: Occupational Therapy

## 2021-05-25 DIAGNOSIS — M13842 Other specified arthritis, left hand: Secondary | ICD-10-CM | POA: Diagnosis not present

## 2021-06-11 DIAGNOSIS — E7800 Pure hypercholesterolemia, unspecified: Secondary | ICD-10-CM | POA: Diagnosis not present

## 2021-06-11 DIAGNOSIS — Z23 Encounter for immunization: Secondary | ICD-10-CM | POA: Diagnosis not present

## 2021-06-11 DIAGNOSIS — R69 Illness, unspecified: Secondary | ICD-10-CM | POA: Diagnosis not present

## 2021-06-11 DIAGNOSIS — G4700 Insomnia, unspecified: Secondary | ICD-10-CM | POA: Diagnosis not present

## 2021-08-16 DIAGNOSIS — M65331 Trigger finger, right middle finger: Secondary | ICD-10-CM | POA: Diagnosis not present

## 2021-08-16 DIAGNOSIS — M13842 Other specified arthritis, left hand: Secondary | ICD-10-CM | POA: Diagnosis not present

## 2021-08-16 DIAGNOSIS — M65332 Trigger finger, left middle finger: Secondary | ICD-10-CM | POA: Diagnosis not present

## 2021-09-10 DIAGNOSIS — M13842 Other specified arthritis, left hand: Secondary | ICD-10-CM | POA: Diagnosis not present

## 2021-09-10 DIAGNOSIS — M1812 Unilateral primary osteoarthritis of first carpometacarpal joint, left hand: Secondary | ICD-10-CM | POA: Diagnosis not present

## 2021-09-21 DIAGNOSIS — M1990 Unspecified osteoarthritis, unspecified site: Secondary | ICD-10-CM | POA: Diagnosis not present

## 2021-09-21 DIAGNOSIS — Z882 Allergy status to sulfonamides status: Secondary | ICD-10-CM | POA: Diagnosis not present

## 2021-09-21 DIAGNOSIS — R32 Unspecified urinary incontinence: Secondary | ICD-10-CM | POA: Diagnosis not present

## 2021-09-21 DIAGNOSIS — Z885 Allergy status to narcotic agent status: Secondary | ICD-10-CM | POA: Diagnosis not present

## 2021-09-21 DIAGNOSIS — R69 Illness, unspecified: Secondary | ICD-10-CM | POA: Diagnosis not present

## 2021-09-21 DIAGNOSIS — I10 Essential (primary) hypertension: Secondary | ICD-10-CM | POA: Diagnosis not present

## 2021-09-21 DIAGNOSIS — G4700 Insomnia, unspecified: Secondary | ICD-10-CM | POA: Diagnosis not present

## 2021-09-21 DIAGNOSIS — Z809 Family history of malignant neoplasm, unspecified: Secondary | ICD-10-CM | POA: Diagnosis not present

## 2021-09-25 DIAGNOSIS — M13842 Other specified arthritis, left hand: Secondary | ICD-10-CM | POA: Diagnosis not present

## 2021-09-25 DIAGNOSIS — Z4789 Encounter for other orthopedic aftercare: Secondary | ICD-10-CM | POA: Diagnosis not present

## 2021-10-19 DIAGNOSIS — M13842 Other specified arthritis, left hand: Secondary | ICD-10-CM | POA: Diagnosis not present

## 2021-10-19 DIAGNOSIS — M79645 Pain in left finger(s): Secondary | ICD-10-CM | POA: Diagnosis not present

## 2021-10-29 DIAGNOSIS — M79645 Pain in left finger(s): Secondary | ICD-10-CM | POA: Diagnosis not present

## 2021-11-05 DIAGNOSIS — M79645 Pain in left finger(s): Secondary | ICD-10-CM | POA: Diagnosis not present

## 2021-11-16 DIAGNOSIS — M79645 Pain in left finger(s): Secondary | ICD-10-CM | POA: Diagnosis not present

## 2021-11-16 DIAGNOSIS — M13842 Other specified arthritis, left hand: Secondary | ICD-10-CM | POA: Diagnosis not present

## 2021-11-30 DIAGNOSIS — M79645 Pain in left finger(s): Secondary | ICD-10-CM | POA: Diagnosis not present

## 2021-12-17 DIAGNOSIS — M13842 Other specified arthritis, left hand: Secondary | ICD-10-CM | POA: Diagnosis not present

## 2021-12-25 DIAGNOSIS — Z1231 Encounter for screening mammogram for malignant neoplasm of breast: Secondary | ICD-10-CM | POA: Diagnosis not present

## 2021-12-25 DIAGNOSIS — Z23 Encounter for immunization: Secondary | ICD-10-CM | POA: Diagnosis not present

## 2021-12-25 DIAGNOSIS — K589 Irritable bowel syndrome without diarrhea: Secondary | ICD-10-CM | POA: Diagnosis not present

## 2021-12-25 DIAGNOSIS — F419 Anxiety disorder, unspecified: Secondary | ICD-10-CM | POA: Diagnosis not present

## 2021-12-25 DIAGNOSIS — R69 Illness, unspecified: Secondary | ICD-10-CM | POA: Diagnosis not present

## 2021-12-25 DIAGNOSIS — E7800 Pure hypercholesterolemia, unspecified: Secondary | ICD-10-CM | POA: Diagnosis not present

## 2021-12-25 DIAGNOSIS — M25512 Pain in left shoulder: Secondary | ICD-10-CM | POA: Diagnosis not present

## 2021-12-25 DIAGNOSIS — E785 Hyperlipidemia, unspecified: Secondary | ICD-10-CM | POA: Diagnosis not present

## 2021-12-25 DIAGNOSIS — Z1331 Encounter for screening for depression: Secondary | ICD-10-CM | POA: Diagnosis not present

## 2021-12-25 DIAGNOSIS — G4700 Insomnia, unspecified: Secondary | ICD-10-CM | POA: Diagnosis not present

## 2021-12-25 DIAGNOSIS — M25511 Pain in right shoulder: Secondary | ICD-10-CM | POA: Diagnosis not present

## 2021-12-25 DIAGNOSIS — Z0000 Encounter for general adult medical examination without abnormal findings: Secondary | ICD-10-CM | POA: Diagnosis not present

## 2022-01-07 DIAGNOSIS — M25512 Pain in left shoulder: Secondary | ICD-10-CM | POA: Diagnosis not present

## 2022-03-13 DIAGNOSIS — L820 Inflamed seborrheic keratosis: Secondary | ICD-10-CM | POA: Diagnosis not present

## 2022-04-08 DIAGNOSIS — H2513 Age-related nuclear cataract, bilateral: Secondary | ICD-10-CM | POA: Diagnosis not present

## 2022-04-08 DIAGNOSIS — H5213 Myopia, bilateral: Secondary | ICD-10-CM | POA: Diagnosis not present

## 2022-04-17 DIAGNOSIS — L820 Inflamed seborrheic keratosis: Secondary | ICD-10-CM | POA: Diagnosis not present

## 2022-04-19 DIAGNOSIS — M25512 Pain in left shoulder: Secondary | ICD-10-CM | POA: Diagnosis not present

## 2022-05-15 DIAGNOSIS — M7502 Adhesive capsulitis of left shoulder: Secondary | ICD-10-CM | POA: Diagnosis not present

## 2022-07-02 DIAGNOSIS — M25512 Pain in left shoulder: Secondary | ICD-10-CM | POA: Diagnosis not present

## 2022-07-02 DIAGNOSIS — E785 Hyperlipidemia, unspecified: Secondary | ICD-10-CM | POA: Diagnosis not present

## 2022-07-02 DIAGNOSIS — K589 Irritable bowel syndrome without diarrhea: Secondary | ICD-10-CM | POA: Diagnosis not present

## 2022-07-02 DIAGNOSIS — M25511 Pain in right shoulder: Secondary | ICD-10-CM | POA: Diagnosis not present

## 2022-07-02 DIAGNOSIS — R1030 Lower abdominal pain, unspecified: Secondary | ICD-10-CM | POA: Diagnosis not present

## 2022-07-02 DIAGNOSIS — Z0000 Encounter for general adult medical examination without abnormal findings: Secondary | ICD-10-CM | POA: Diagnosis not present

## 2022-07-02 DIAGNOSIS — M85859 Other specified disorders of bone density and structure, unspecified thigh: Secondary | ICD-10-CM | POA: Diagnosis not present

## 2022-07-02 DIAGNOSIS — F419 Anxiety disorder, unspecified: Secondary | ICD-10-CM | POA: Diagnosis not present

## 2022-07-02 DIAGNOSIS — Z1239 Encounter for other screening for malignant neoplasm of breast: Secondary | ICD-10-CM | POA: Diagnosis not present

## 2022-09-13 DIAGNOSIS — Z791 Long term (current) use of non-steroidal anti-inflammatories (NSAID): Secondary | ICD-10-CM | POA: Diagnosis not present

## 2022-09-13 DIAGNOSIS — R32 Unspecified urinary incontinence: Secondary | ICD-10-CM | POA: Diagnosis not present

## 2022-09-13 DIAGNOSIS — Z809 Family history of malignant neoplasm, unspecified: Secondary | ICD-10-CM | POA: Diagnosis not present

## 2022-09-13 DIAGNOSIS — M1990 Unspecified osteoarthritis, unspecified site: Secondary | ICD-10-CM | POA: Diagnosis not present

## 2022-09-13 DIAGNOSIS — Z008 Encounter for other general examination: Secondary | ICD-10-CM | POA: Diagnosis not present

## 2022-09-13 DIAGNOSIS — I10 Essential (primary) hypertension: Secondary | ICD-10-CM | POA: Diagnosis not present

## 2022-09-13 DIAGNOSIS — H269 Unspecified cataract: Secondary | ICD-10-CM | POA: Diagnosis not present

## 2022-09-13 DIAGNOSIS — G4700 Insomnia, unspecified: Secondary | ICD-10-CM | POA: Diagnosis not present

## 2022-09-13 DIAGNOSIS — K589 Irritable bowel syndrome without diarrhea: Secondary | ICD-10-CM | POA: Diagnosis not present

## 2022-09-13 DIAGNOSIS — M8580 Other specified disorders of bone density and structure, unspecified site: Secondary | ICD-10-CM | POA: Diagnosis not present

## 2022-09-13 DIAGNOSIS — E785 Hyperlipidemia, unspecified: Secondary | ICD-10-CM | POA: Diagnosis not present

## 2022-09-13 DIAGNOSIS — Z882 Allergy status to sulfonamides status: Secondary | ICD-10-CM | POA: Diagnosis not present

## 2022-09-13 DIAGNOSIS — Z8249 Family history of ischemic heart disease and other diseases of the circulatory system: Secondary | ICD-10-CM | POA: Diagnosis not present

## 2022-09-23 DIAGNOSIS — M7502 Adhesive capsulitis of left shoulder: Secondary | ICD-10-CM | POA: Diagnosis not present

## 2022-10-03 DIAGNOSIS — H2513 Age-related nuclear cataract, bilateral: Secondary | ICD-10-CM | POA: Diagnosis not present

## 2022-10-04 DIAGNOSIS — M25512 Pain in left shoulder: Secondary | ICD-10-CM | POA: Diagnosis not present

## 2022-10-04 DIAGNOSIS — M7502 Adhesive capsulitis of left shoulder: Secondary | ICD-10-CM | POA: Diagnosis not present

## 2022-10-05 ENCOUNTER — Ambulatory Visit
Admission: EM | Admit: 2022-10-05 | Discharge: 2022-10-05 | Disposition: A | Discharge status: Home / Self Care | Payer: Medicare HMO | Attending: Family Medicine | Admitting: Family Medicine

## 2022-10-05 DIAGNOSIS — N3090 Cystitis, unspecified without hematuria: Secondary | ICD-10-CM | POA: Insufficient documentation

## 2022-10-05 LAB — POCT URINALYSIS DIP (MANUAL ENTRY)
Bilirubin, UA: NEGATIVE
Glucose, UA: NEGATIVE mg/dL
Ketones, POC UA: NEGATIVE mg/dL
Nitrite, UA: NEGATIVE
Protein Ur, POC: NEGATIVE mg/dL
Spec Grav, UA: 1.015 (ref 1.010–1.025)
Urobilinogen, UA: 0.2 E.U./dL
pH, UA: 7 (ref 5.0–8.0)

## 2022-10-05 MED ORDER — CEPHALEXIN 500 MG PO CAPS: 500.0000 mg | ORAL_CAPSULE | Freq: Two times a day (BID) | ORAL | 0 refills | Status: DC

## 2022-10-05 NOTE — ED Triage Notes (Signed)
Pt reports she has sxs of low urine, burning with urination, and bladder pressure x 2 days.

## 2022-10-05 NOTE — Discharge Instructions (Addendum)
You have had labs (urine culture) sent today. We will call you with any significant abnormalities or if there is need to begin or change treatment or pursue further follow up.  You may also review your test results online through MyChart. If you do not have a MyChart account, instructions to sign up should be on your discharge paperwork.  

## 2022-10-07 NOTE — ED Provider Notes (Signed)
  [facility]    ASSESSMENT & PLAN:  1. Cystitis    Begin: Meds ordered this encounter  Medications   cephALEXin (KEFLEX) 500 MG capsule    Sig: Take 1 capsule (500 mg total) by mouth 2 (two) times daily.    Dispense:  10 capsule    Refill:  0    No signs of pyelonephritis. Urine culture sent. Will notify patient of any significant results. Ensure proper hydration. Will follow up with her PCP or here if not showing improvement over the next 48 hours, sooner if needed.  Outlined signs and symptoms indicating need for more acute intervention. Patient verbalized understanding. After Visit Summary given.  SUBJECTIVE:  Christy Stanley is a 67 y.o. female who reports she has sxs of low urine, burning with urination, and bladder pressure x 2 days.  No tx PTA. LMP: No LMP recorded. Patient has had a hysterectomy.  OBJECTIVE:  Vitals:   10/05/22 1311  BP: 112/74  Pulse: 81  Resp: 18  Temp: 98 F (36.7 C)  TempSrc: Oral  SpO2: 98%   General appearance: alert; no distress Skin: warm and dry Neurologic: normal gait Psychological: alert and cooperative; normal mood and affect    Allergies  Allergen Reactions   Morphine And Codeine Itching   Elemental Sulfur Itching and Rash    Past Medical History:  Diagnosis Date   Allergy    Anxiety    Depression    Elbow arthritis    tendionitis, wears brace   Social History   Socioeconomic History   Marital status: Married    Spouse name: Not on file   Number of children: Not on file   Years of education: Not on file   Highest education level: Not on file  Occupational History   Not on file  Tobacco Use   Smoking status: Never   Smokeless tobacco: Never  Substance and Sexual Activity   Alcohol use: No   Drug use: No   Sexual activity: Not on file  Other Topics Concern   Not on file  Social History Narrative   Not on file   Social Determinants of Health   Financial Resource Strain: Not on file   Food Insecurity: Not on file  Transportation Needs: Not on file  Physical Activity: Not on file  Stress: Not on file  Social Connections: Not on file  Intimate Partner Violence: Not on file   Family History  Problem Relation Age of Onset   Heart disease Mother    Hepatitis Mother    Cirrhosis Mother    Prostate cancer Father    Cancer Father         Mardella Layman, MD 10/07/22 [phone]

## 2022-10-08 DIAGNOSIS — M7502 Adhesive capsulitis of left shoulder: Secondary | ICD-10-CM | POA: Diagnosis not present

## 2022-10-08 DIAGNOSIS — M25512 Pain in left shoulder: Secondary | ICD-10-CM | POA: Diagnosis not present

## 2022-10-08 LAB — URINE CULTURE: Culture: >=100000 — AB

## 2022-10-10 DIAGNOSIS — M7502 Adhesive capsulitis of left shoulder: Secondary | ICD-10-CM | POA: Diagnosis not present

## 2022-10-10 DIAGNOSIS — M25512 Pain in left shoulder: Secondary | ICD-10-CM | POA: Diagnosis not present

## 2022-10-16 DIAGNOSIS — M7502 Adhesive capsulitis of left shoulder: Secondary | ICD-10-CM | POA: Diagnosis not present

## 2022-10-16 DIAGNOSIS — M25512 Pain in left shoulder: Secondary | ICD-10-CM | POA: Diagnosis not present

## 2022-10-18 DIAGNOSIS — M25512 Pain in left shoulder: Secondary | ICD-10-CM | POA: Diagnosis not present

## 2022-10-18 DIAGNOSIS — M7502 Adhesive capsulitis of left shoulder: Secondary | ICD-10-CM | POA: Diagnosis not present

## 2022-10-21 DIAGNOSIS — M25512 Pain in left shoulder: Secondary | ICD-10-CM | POA: Diagnosis not present

## 2022-10-21 DIAGNOSIS — M7502 Adhesive capsulitis of left shoulder: Secondary | ICD-10-CM | POA: Diagnosis not present

## 2022-10-23 DIAGNOSIS — M25512 Pain in left shoulder: Secondary | ICD-10-CM | POA: Diagnosis not present

## 2022-10-23 DIAGNOSIS — M7502 Adhesive capsulitis of left shoulder: Secondary | ICD-10-CM | POA: Diagnosis not present

## 2022-10-25 DIAGNOSIS — M25512 Pain in left shoulder: Secondary | ICD-10-CM | POA: Diagnosis not present

## 2022-10-25 DIAGNOSIS — M7502 Adhesive capsulitis of left shoulder: Secondary | ICD-10-CM | POA: Diagnosis not present

## 2022-10-27 ENCOUNTER — Ambulatory Visit
Admission: EM | Admit: 2022-10-27 | Discharge: 2022-10-27 | Disposition: A | Discharge status: Home / Self Care | Payer: Medicare HMO | Attending: Internal Medicine | Admitting: Internal Medicine

## 2022-10-27 DIAGNOSIS — N390 Urinary tract infection, site not specified: Secondary | ICD-10-CM | POA: Insufficient documentation

## 2022-10-27 DIAGNOSIS — N3001 Acute cystitis with hematuria: Secondary | ICD-10-CM | POA: Insufficient documentation

## 2022-10-27 LAB — POCT URINALYSIS DIP (MANUAL ENTRY)
Bilirubin, UA: NEGATIVE
Glucose, UA: 100 mg/dL — AB
Ketones, POC UA: NEGATIVE mg/dL
Nitrite, UA: POSITIVE — AB
Protein Ur, POC: 100 mg/dL — AB
Spec Grav, UA: 1.02 (ref 1.010–1.025)
Urobilinogen, UA: 1 U/dL
pH, UA: 5 (ref 5.0–8.0)

## 2022-10-27 MED ORDER — AMOXICILLIN-POT CLAVULANATE 875-125 MG PO TABS: 1.0000 | ORAL_TABLET | Freq: Two times a day (BID) | ORAL | 0 refills | Status: DC

## 2022-10-27 NOTE — Discharge Instructions (Signed)

## 2022-10-27 NOTE — ED Provider Notes (Addendum)
[facility]    CSN: [id number] Arrival date & time: 10/27/22  0813      History   Chief Complaint No chief complaint on file.   HPI Christy Stanley is a 67 y.o. female.   Christy Stanley is a 67 y.o. female presenting for chief complaint of urinary frequency, urgency, bladder pressure and dysuria that started this morning. She was treated for UTI showing significant E. Coli growth on the urine culture on 10/05/2022 with keflex. She took all of the antibiotic as prescribed and symptoms improved until this morning. Denies fevers, chills, body aches, N/V, diarrhea, upper abdominal pain, low back pain, and dizziness. No flank pain reported or gross hematuria. Denies frequent intake of known urinary irritants or use of SGLT-2 inhibitor. She has never been seen by urology and reports frequent UTIs. She used AZO to treat symptoms prior to arrival with some relief.      Past Medical History:  Diagnosis Date   Allergy    Anxiety    Depression    Elbow arthritis    tendionitis, wears brace    There are no problems to display for this patient.   Past Surgical History:  Procedure Laterality Date   ABDOMINAL HYSTERECTOMY     partial   CESAREAN SECTION     x1   COLONOSCOPY WITH PROPOFOL N/A 03/13/2015   Procedure: COLONOSCOPY WITH PROPOFOL;  Surgeon: Charolett Bumpers, MD;  Location: [facility];  Service: Endoscopy;  Laterality: N/A;   NECK DISSECTION     x 2, vertebrae fixed   TUBAL LIGATION      OB History   No obstetric history on file.      Home Medications    Prior to Admission medications   Medication Sig Start Date End Date Taking? Authorizing Provider  amoxicillin-clavulanate (AUGMENTIN) 875-125 MG tablet Take 1 tablet by mouth every 12 (twelve) hours. 10/27/22  Yes Carlisle Beers, FNP  atorvastatin (LIPITOR) 10 MG tablet TAKE 1 TABLET BY MOUTH EVERY DAY FOR 90 DAYS    [provider]  LORazepam (ATIVAN) 1 MG tablet TAKE 1 TABLET BY MOUTH  AT BEDTIME AS NEEDED FOR TRAVEL, CAMPING AS NEEDED 30 DAYS 09/14/22   [provider]  sertraline (ZOLOFT) 100 MG tablet Take 100 mg by mouth daily.    [provider]  zolpidem (AMBIEN) 10 MG tablet 1 TABLET AS NEEDED BY MOUTH 3 NIGHTS /WEEK 30 DAYS 08/02/13   [provider]  fluticasone (FLONASE) 50 MCG/ACT nasal spray Place 1 spray into both nostrils as needed.  12/21/12 08/07/18  [provider]  PREMARIN 0.45 MG tablet Take 0.45 mg by mouth daily.  11/13/12 08/07/18  [provider]    Family History Family History  Problem Relation Age of Onset   Heart disease Mother    Hepatitis Mother    Cirrhosis Mother    Prostate cancer Father    Cancer Father     Social History Social History   Tobacco Use   Smoking status: Never   Smokeless tobacco: Never  Substance Use Topics   Alcohol use: No   Drug use: No     Allergies   Morphine and codeine and Elemental sulfur   Review of Systems Review of Systems Per HPI  Physical Exam Triage Vital Signs ED Triage Vitals [10/27/22 0838]  Encounter Vitals Group     BP 116/74     Systolic BP Percentile      Diastolic BP Percentile  Pulse Rate 87     Resp 18     Temp 97.7 F (36.5 C)     Temp Source Oral     SpO2 94 %     Weight      Height      Head Circumference      Peak Flow      Pain Score 6     Pain Loc      Pain Education      Exclude from Growth Chart    No data found.  Updated Vital Signs BP 116/74 (BP Location: Right Arm)   Pulse 87   Temp 97.7 F (36.5 C) (Oral)   Resp 18   SpO2 94%   Visual Acuity Right Eye Distance:   Left Eye Distance:   Bilateral Distance:    Right Eye Near:   Left Eye Near:    Bilateral Near:     Physical Exam Vitals and nursing note reviewed.  Constitutional:      Appearance: She is not ill-appearing or toxic-appearing.  HENT:     Head: Normocephalic and atraumatic.     Right Ear: Hearing and external ear normal.     Left  Ear: Hearing and external ear normal.     Nose: Nose normal.     Mouth/Throat:     Lips: Pink.  Eyes:     General: Lids are normal. Vision grossly intact. Gaze aligned appropriately.     Extraocular Movements: Extraocular movements intact.     Conjunctiva/sclera: Conjunctivae normal.  Cardiovascular:     Rate and Rhythm: Normal rate and regular rhythm.     Heart sounds: Normal heart sounds, S1 normal and S2 normal.  Pulmonary:     Effort: Pulmonary effort is normal. No respiratory distress.     Breath sounds: Normal breath sounds and air entry.  Abdominal:     General: Bowel sounds are normal.     Palpations: Abdomen is soft.     Tenderness: There is no abdominal tenderness. There is no right CVA tenderness, left CVA tenderness or guarding.  Musculoskeletal:     Cervical back: Neck supple.  Skin:    General: Skin is warm and dry.     Capillary Refill: Capillary refill takes less than 2 seconds.     Findings: No rash.  Neurological:     General: No focal deficit present.     Mental Status: She is alert and oriented to person, place, and time. Mental status is at baseline.     Cranial Nerves: No dysarthria or facial asymmetry.  Psychiatric:        Mood and Affect: Mood normal.        Speech: Speech normal.        Behavior: Behavior normal.        Thought Content: Thought content normal.        Judgment: Judgment normal.      UC Treatments / Results  Labs (all labs ordered are listed, but only abnormal results are displayed) Labs Reviewed  POCT URINALYSIS DIP (MANUAL ENTRY) - Abnormal; Notable for the following components:      Result Value   Color, UA orange (*)    Clarity, UA cloudy (*)    Glucose, UA =100 (*)    Blood, UA moderate (*)    Protein Ur, POC =100 (*)    Nitrite, UA Positive (*)    Leukocytes, UA Large (3+) (*)    All other components within normal limits  URINE CULTURE    EKG   Radiology No results found.  Procedures Procedures (including  critical care time)  Medications Ordered in UC Medications - No data to display  Initial Impression / Assessment and Plan / UC Course  I have reviewed the triage vital signs and the nursing notes.  Pertinent labs & imaging results that were available during my care of the patient were reviewed by me and considered in my medical decision making (see chart for details).   1. Acute cystitis with hematuria, recurrent UTI Presentation is consistent with acute uncomplicated cystitis. Urine culture pending.   Patient is nontoxic in appearance with hemodynamically stable vital signs. Low suspicion for acute pyelonephritis. Low suspicion for kidney stone or infected stone. No indication for labs or imaging at this time.   Augmentin antibiotic sent to pharmacy. No available blood work to review in the last 10 years in our health system or in care everywhere. Unsure of renal function at baseline, however patient denies history of CKD.  Patient to push fluids to stay well hydrated and reduce intake of known urinary irritants.  Counseled patient on potential for adverse effects with medications prescribed/recommended today, strict ER and return-to-clinic precautions discussed, patient verbalized understanding.   Final Clinical Impressions(s) / UC Diagnoses   Final diagnoses:  Acute cystitis with hematuria     Discharge Instructions      Your urine shows you likely have a urinary tract infection.  I have sent your urine for culture to confirm this.  We will call you if we need to change your antibiotic when we find out the type of bacteria growing in your bladder.  Take antibiotic as directed with a snack/food to avoid stomach upset. To avoid GI upset please take this medication with food.   Avoid drinking beverages that irritate the urinary tract like sodas, tea, coffee, or juice. Drink plenty of water to stay well hydrated and prevent severe infection.  If you develop any new or  worsening symptoms or if your symptoms do not start to improve, pleases return here or follow-up with your primary care provider. If your symptoms are severe, please go to the emergency room.=   ED Prescriptions     Medication Sig Dispense Auth. Provider   amoxicillin-clavulanate (AUGMENTIN) 875-125 MG tablet Take 1 tablet by mouth every 12 (twelve) hours. 14 tablet Carlisle Beers, FNP      PDMP not reviewed this encounter.   Carlisle Beers, FNP 10/28/22 1559    Carlisle Beers, FNP 10/28/22 1600

## 2022-10-27 NOTE — ED Triage Notes (Signed)
Pt reports she has burning with urination, frequency with urination,and bladder pressure since this morning

## 2022-10-29 LAB — URINE CULTURE: Culture: >=100000 — AB

## 2022-11-05 DIAGNOSIS — M25512 Pain in left shoulder: Secondary | ICD-10-CM | POA: Diagnosis not present

## 2022-11-05 DIAGNOSIS — M7502 Adhesive capsulitis of left shoulder: Secondary | ICD-10-CM | POA: Diagnosis not present

## 2022-11-19 DIAGNOSIS — H25812 Combined forms of age-related cataract, left eye: Secondary | ICD-10-CM | POA: Diagnosis not present

## 2022-11-19 DIAGNOSIS — H2512 Age-related nuclear cataract, left eye: Secondary | ICD-10-CM | POA: Diagnosis not present

## 2022-11-19 DIAGNOSIS — Z961 Presence of intraocular lens: Secondary | ICD-10-CM | POA: Diagnosis not present

## 2022-12-03 DIAGNOSIS — H25811 Combined forms of age-related cataract, right eye: Secondary | ICD-10-CM | POA: Diagnosis not present

## 2022-12-03 DIAGNOSIS — H2511 Age-related nuclear cataract, right eye: Secondary | ICD-10-CM | POA: Diagnosis not present

## 2022-12-03 DIAGNOSIS — Z961 Presence of intraocular lens: Secondary | ICD-10-CM | POA: Diagnosis not present

## 2022-12-31 DIAGNOSIS — Z23 Encounter for immunization: Secondary | ICD-10-CM | POA: Diagnosis not present

## 2022-12-31 DIAGNOSIS — K5090 Crohn's disease, unspecified, without complications: Secondary | ICD-10-CM | POA: Diagnosis not present

## 2022-12-31 DIAGNOSIS — Z1231 Encounter for screening mammogram for malignant neoplasm of breast: Secondary | ICD-10-CM | POA: Diagnosis not present

## 2022-12-31 DIAGNOSIS — Z1331 Encounter for screening for depression: Secondary | ICD-10-CM | POA: Diagnosis not present

## 2022-12-31 DIAGNOSIS — Z79899 Other long term (current) drug therapy: Secondary | ICD-10-CM | POA: Diagnosis not present

## 2022-12-31 DIAGNOSIS — Z0000 Encounter for general adult medical examination without abnormal findings: Secondary | ICD-10-CM | POA: Diagnosis not present

## 2022-12-31 DIAGNOSIS — M8588 Other specified disorders of bone density and structure, other site: Secondary | ICD-10-CM | POA: Diagnosis not present

## 2022-12-31 DIAGNOSIS — E785 Hyperlipidemia, unspecified: Secondary | ICD-10-CM | POA: Diagnosis not present

## 2023-06-04 ENCOUNTER — Ambulatory Visit
Admission: EM | Admit: 2023-06-04 | Discharge: 2023-06-04 | Disposition: A | Discharge status: Home / Self Care | Attending: Family Medicine | Admitting: Family Medicine

## 2023-06-04 VITALS — BP 130/81 | HR 71 | Temp 98.1°F | Resp 16 | Ht 64.0 in | Wt 160.0 lb

## 2023-06-04 DIAGNOSIS — R8290 Unspecified abnormal findings in urine: Secondary | ICD-10-CM | POA: Diagnosis not present

## 2023-06-04 DIAGNOSIS — R300 Dysuria: Secondary | ICD-10-CM | POA: Diagnosis not present

## 2023-06-04 LAB — POCT URINALYSIS DIP (MANUAL ENTRY)
Bilirubin, UA: NEGATIVE
Glucose, UA: NEGATIVE mg/dL
Ketones, POC UA: NEGATIVE mg/dL
Nitrite, UA: NEGATIVE
Protein Ur, POC: NEGATIVE mg/dL
Spec Grav, UA: <=1.005 — AB (ref 1.010–1.025)
Urobilinogen, UA: 0.2 U/dL
pH, UA: 5.5 (ref 5.0–8.0)

## 2023-06-04 MED ORDER — AMOXICILLIN-POT CLAVULANATE 875-125 MG PO TABS: 1.0000 | ORAL_TABLET | Freq: Two times a day (BID) | ORAL | 0 refills | Status: DC

## 2023-06-04 NOTE — Discharge Instructions (Signed)
 Augmentin  twice daily for 7 days Ensure adequate oral hydration May use Azo for symptom management Urine culture currently pending

## 2023-06-04 NOTE — ED Triage Notes (Signed)
 Patient c/o dysuria and urinary urgency x 1 week.  Sx's seem to have resolved w/cranberry tabs.  This morning began having dysuria again.  Denies any hematuria.

## 2023-06-04 NOTE — ED Provider Notes (Signed)
 [facility]    CSN: [id number] Arrival date & time: 06/04/23  1442      History   Chief Complaint Chief Complaint  Patient presents with   Dysuria    HPI Christy Stanley is a 68 y.o. female.   Patient presents for evaluation of dysuria that started mid last week.  She was treating the symptoms with cranberry tablets.  States intermittently her lower abdomen and lower left flank region would be uncomfortable.  She has tried ibuprofen for the symptoms.  Denies any abnormal vaginal bleeding or discharge.  She denies any concern for sexually transmitted infections.  No reported fever, chest pain, shortness of breath, vomiting, or diarrhea.  The history is provided by the patient.  Dysuria Associated symptoms: abdominal pain and flank pain   Associated symptoms: no fever, no vaginal discharge and no vomiting     Past Medical History:  Diagnosis Date   Allergy    Anxiety    Depression    Elbow arthritis    tendionitis, wears brace    There are no active problems to display for this patient.   Past Surgical History:  Procedure Laterality Date   ABDOMINAL HYSTERECTOMY     partial   CESAREAN SECTION     x1   COLONOSCOPY WITH PROPOFOL  N/A 03/13/2015   Procedure: COLONOSCOPY WITH PROPOFOL ;  Surgeon: Garrett Kallman, MD;  Location: [facility];  Service: Endoscopy;  Laterality: N/A;   NECK DISSECTION     x 2, vertebrae fixed   TUBAL LIGATION      OB History   No obstetric history on file.      Home Medications    Prior to Admission medications   Medication Sig Start Date End Date Taking? Authorizing Provider  amoxicillin -clavulanate (AUGMENTIN ) 875-125 MG tablet Take 1 tablet by mouth every 12 (twelve) hours. 06/04/23  Yes Genene Kennel, FNP  atorvastatin (LIPITOR) 10 MG tablet TAKE 1 TABLET BY MOUTH EVERY DAY FOR 90 DAYS   Yes [provider]  LORazepam (ATIVAN) 1 MG tablet TAKE 1 TABLET BY MOUTH AT BEDTIME AS NEEDED FOR TRAVEL, CAMPING AS  NEEDED 30 DAYS 09/14/22  Yes [provider]  sertraline (ZOLOFT) 100 MG tablet Take 100 mg by mouth daily.   Yes [provider]  zolpidem (AMBIEN) 10 MG tablet 1 TABLET AS NEEDED BY MOUTH 3 NIGHTS /WEEK 30 DAYS 08/02/13  Yes [provider]  fluticasone (FLONASE) 50 MCG/ACT nasal spray Place 1 spray into both nostrils as needed.  12/21/12 08/07/18  [provider]  PREMARIN 0.45 MG tablet Take 0.45 mg by mouth daily.  11/13/12 08/07/18  [provider]    Family History Family History  Problem Relation Age of Onset   Heart disease Mother    Hepatitis Mother    Cirrhosis Mother    Prostate cancer Father    Cancer Father     Social History Social History   Tobacco Use   Smoking status: Never   Smokeless tobacco: Never  Vaping Use   Vaping status: Never Used  Substance Use Topics   Alcohol use: No   Drug use: No     Allergies   Morphine and codeine and Elemental sulfur   Review of Systems Review of Systems  Constitutional:  Negative for chills, fatigue and fever.  HENT:  Negative for congestion, rhinorrhea and sore throat.   Eyes:  Negative for pain and redness.  Respiratory:  Negative for cough, chest tightness and  shortness of breath.   Cardiovascular:  Negative for chest pain and palpitations.  Gastrointestinal:  Positive for abdominal pain. Negative for diarrhea and vomiting.  Genitourinary:  Positive for dysuria and flank pain. Negative for pelvic pain, vaginal bleeding, vaginal discharge and vaginal pain.  Musculoskeletal:  Negative for arthralgias and myalgias.  Skin:  Negative for rash.  Neurological:  Negative for dizziness and headaches.     Physical Exam Triage Vital Signs ED Triage Vitals  Encounter Vitals Group     BP 06/04/23 1510 130/81     Systolic BP Percentile --      Diastolic BP Percentile --      Pulse Rate 06/04/23 1510 71     Resp 06/04/23 1510 16     Temp 06/04/23 1510 98.1 F (36.7 C)     Temp  Source 06/04/23 1510 Oral     SpO2 06/04/23 1510 94 %     Weight 06/04/23 1512 160 lb (72.6 kg)     Height 06/04/23 1512 5\' 4"  (1.626 m)     Head Circumference --      Peak Flow --      Pain Score 06/04/23 1512 6     Pain Loc --      Pain Education --      Exclude from Growth Chart --    No data found.  Updated Vital Signs BP 130/81 (BP Location: Right Arm)   Pulse 71   Temp 98.1 F (36.7 C) (Oral)   Resp 16   Ht 5\' 4"  (1.626 m)   Wt 160 lb (72.6 kg)   SpO2 94%   BMI 27.46 kg/m   Physical Exam Vitals and nursing note reviewed.  Constitutional:      Appearance: Normal appearance.  HENT:     Head: Normocephalic.  Cardiovascular:     Rate and Rhythm: Normal rate and regular rhythm.     Heart sounds: Normal heart sounds.  Pulmonary:     Effort: Pulmonary effort is normal.     Breath sounds: Normal breath sounds.  Abdominal:     General: Bowel sounds are normal.  Skin:    General: Skin is warm and dry.  Neurological:     General: No focal deficit present.     Mental Status: She is alert and oriented to person, place, and time.  Psychiatric:        Mood and Affect: Mood normal.        Behavior: Behavior normal.        Thought Content: Thought content normal.        Judgment: Judgment normal.    UC Treatments / Results  Labs (all labs ordered are listed, but only abnormal results are displayed) Labs Reviewed  POCT URINALYSIS DIP (MANUAL ENTRY) - Abnormal; Notable for the following components:      Result Value   Color, UA light yellow (*)    Spec Grav, UA <=1.005 (*)    Blood, UA trace-intact (*)    Leukocytes, UA Small (1+) (*)    All other components within normal limits  URINE CULTURE    EKG   Radiology No results found.  Procedures Procedures (including critical care time)  Medications Ordered in UC Medications - No data to display  Initial Impression / Assessment and Plan / UC Course  I have reviewed the triage vital signs and the nursing  notes.  Pertinent labs & imaging results that were available during my care of the patient were reviewed  by me and considered in my medical decision making (see chart for details).    Patient presenting for evaluation of an approximate week history of dysuria.  POCT UA demonstrating trace blood and small leukocytes.  Urine culture has been collected and currently pending.  Patient started on Augmentin  twice daily for 7 days as she verbalizes doing well on this medication the last time this occurred.  Ensure adequate oral hydration.  May use Azo as needed for discomfort.  Final Clinical Impressions(s) / UC Diagnoses   Final diagnoses:  Dysuria  Abnormal urine findings     Discharge Instructions      Augmentin  twice daily for 7 days Ensure adequate oral hydration May use Azo for symptom management Urine culture currently pending    ED Prescriptions     Medication Sig Dispense Auth. Provider   amoxicillin -clavulanate (AUGMENTIN ) 875-125 MG tablet Take 1 tablet by mouth every 12 (twelve) hours. 14 tablet Genene Kennel, FNP      PDMP not reviewed this encounter.   Genene Kennel, FNP 06/04/23 1537

## 2023-07-09 DIAGNOSIS — E7800 Pure hypercholesterolemia, unspecified: Secondary | ICD-10-CM | POA: Diagnosis not present

## 2023-07-09 DIAGNOSIS — D170 Benign lipomatous neoplasm of skin and subcutaneous tissue of head, face and neck: Secondary | ICD-10-CM | POA: Diagnosis not present

## 2023-07-09 DIAGNOSIS — F419 Anxiety disorder, unspecified: Secondary | ICD-10-CM | POA: Diagnosis not present

## 2023-08-06 DIAGNOSIS — M25511 Pain in right shoulder: Secondary | ICD-10-CM | POA: Diagnosis not present

## 2023-08-06 DIAGNOSIS — M542 Cervicalgia: Secondary | ICD-10-CM | POA: Diagnosis not present

## 2023-08-08 DIAGNOSIS — M25562 Pain in left knee: Secondary | ICD-10-CM | POA: Diagnosis not present

## 2023-08-28 ENCOUNTER — Other Ambulatory Visit: Payer: Self-pay

## 2023-08-28 ENCOUNTER — Encounter: Payer: Self-pay | Admitting: Emergency Medicine

## 2023-08-28 ENCOUNTER — Ambulatory Visit
Admission: EM | Admit: 2023-08-28 | Discharge: 2023-08-28 | Disposition: A | Discharge status: Home / Self Care | Attending: Family Medicine | Admitting: Family Medicine

## 2023-08-28 DIAGNOSIS — N390 Urinary tract infection, site not specified: Secondary | ICD-10-CM | POA: Diagnosis not present

## 2023-08-28 LAB — POCT URINE DIPSTICK
Bilirubin, UA: NEGATIVE
Glucose, UA: NEGATIVE mg/dL
Ketones, POC UA: NEGATIVE mg/dL
Nitrite, UA: NEGATIVE
POC PROTEIN,UA: NEGATIVE
Spec Grav, UA: 1.01 (ref 1.010–1.025)
Urobilinogen, UA: 0.2 U/dL
pH, UA: 5.5 (ref 5.0–8.0)

## 2023-08-28 MED ORDER — AMOXICILLIN-POT CLAVULANATE 875-125 MG PO TABS: 1.0000 | ORAL_TABLET | Freq: Two times a day (BID) | ORAL | 0 refills | Status: AC

## 2023-08-28 NOTE — ED Triage Notes (Signed)
 Pt reports dysuria with urinary frequency since yesterday. Denies taking anything otc, abd pain, known fevers.

## 2023-08-30 LAB — URINE CULTURE: Culture: >=100000 — AB

## 2023-09-01 ENCOUNTER — Ambulatory Visit (HOSPITAL_COMMUNITY): Payer: Self-pay

## 2023-09-01 NOTE — ED Provider Notes (Signed)
 [facility]    CSN: [id number] Arrival date & time: 08/28/23  0803      History   Chief Complaint Chief Complaint  Patient presents with   Dysuria    HPI Christy Stanley is a 68 y.o. female.   Patient presenting today with 1 day history of dysuria, urinary frequency.  Denies fever, chills, abdominal pain, flank pain, nausea, vomiting.  So far not trying anything over-the-counter for symptoms.    Past Medical History:  Diagnosis Date   Allergy    Anxiety    Depression    Elbow arthritis    tendionitis, wears brace    There are no active problems to display for this patient.   Past Surgical History:  Procedure Laterality Date   ABDOMINAL HYSTERECTOMY     partial   CESAREAN SECTION     x1   COLONOSCOPY WITH PROPOFOL  N/A 03/13/2015   Procedure: COLONOSCOPY WITH PROPOFOL ;  Surgeon: Gladis MARLA Louder, MD;  Location: [facility];  Service: Endoscopy;  Laterality: N/A;   NECK DISSECTION     x 2, vertebrae fixed   TUBAL LIGATION      OB History   No obstetric history on file.      Home Medications    Prior to Admission medications   Medication Sig Start Date End Date Taking? Authorizing Provider  amoxicillin -clavulanate (AUGMENTIN ) 875-125 MG tablet Take 1 tablet by mouth every 12 (twelve) hours. 08/28/23   Stuart Vernell Norris, PA-C  atorvastatin (LIPITOR) 10 MG tablet TAKE 1 TABLET BY MOUTH EVERY DAY FOR 90 DAYS    [provider]  LORazepam (ATIVAN) 1 MG tablet TAKE 1 TABLET BY MOUTH AT BEDTIME AS NEEDED FOR TRAVEL, CAMPING AS NEEDED 30 DAYS 09/14/22   [provider]  sertraline (ZOLOFT) 100 MG tablet Take 100 mg by mouth daily.    [provider]  zolpidem (AMBIEN) 10 MG tablet 1 TABLET AS NEEDED BY MOUTH 3 NIGHTS /WEEK 30 DAYS 08/02/13   [provider]  fluticasone (FLONASE) 50 MCG/ACT nasal spray Place 1 spray into both nostrils as needed.  12/21/12 08/07/18  [provider]  PREMARIN 0.45 MG tablet  Take 0.45 mg by mouth daily.  11/13/12 08/07/18  [provider]    Family History Family History  Problem Relation Age of Onset   Heart disease Mother    Hepatitis Mother    Cirrhosis Mother    Prostate cancer Father    Cancer Father     Social History Social History   Tobacco Use   Smoking status: Never   Smokeless tobacco: Never  Vaping Use   Vaping status: Never Used  Substance Use Topics   Alcohol use: No   Drug use: No     Allergies   Morphine and codeine and Elemental sulfur   Review of Systems Review of Systems Per HPI  Physical Exam Triage Vital Signs ED Triage Vitals  Encounter Vitals Group     BP 08/28/23 0814 131/76     Girls Systolic BP Percentile --      Girls Diastolic BP Percentile --      Boys Systolic BP Percentile --      Boys Diastolic BP Percentile --      Pulse Rate 08/28/23 0814 90     Resp 08/28/23 0814 20     Temp 08/28/23 0814 98.2 F (36.8 C)     Temp Source 08/28/23 0814 Oral     SpO2 08/28/23 0814 95 %  Weight --      Height --      Head Circumference --      Peak Flow --      Pain Score 08/28/23 0816 0     Pain Loc --      Pain Education --      Exclude from Growth Chart --    No data found.  Updated Vital Signs BP 131/76 (BP Location: Right Arm)   Pulse 90   Temp 98.2 F (36.8 C) (Oral)   Resp 20   SpO2 95%   Visual Acuity Right Eye Distance:   Left Eye Distance:   Bilateral Distance:    Right Eye Near:   Left Eye Near:    Bilateral Near:     Physical Exam Vitals and nursing note reviewed.  Constitutional:      Appearance: Normal appearance. She is not ill-appearing.  HENT:     Head: Atraumatic.  Eyes:     Extraocular Movements: Extraocular movements intact.     Conjunctiva/sclera: Conjunctivae normal.  Cardiovascular:     Rate and Rhythm: Normal rate.  Pulmonary:     Effort: Pulmonary effort is normal.  Abdominal:     General: Bowel sounds are normal.     Palpations: Abdomen is  soft.  Musculoskeletal:        General: Normal range of motion.     Cervical back: Normal range of motion and neck supple.  Skin:    General: Skin is warm and dry.  Neurological:     Mental Status: She is alert and oriented to person, place, and time.     Motor: No weakness.     Gait: Gait normal.  Psychiatric:        Mood and Affect: Mood normal.        Thought Content: Thought content normal.        Judgment: Judgment normal.      UC Treatments / Results  Labs (all labs ordered are listed, but only abnormal results are displayed) Labs Reviewed  URINE CULTURE - Abnormal; Notable for the following components:      Result Value   Culture >=100,000 COLONIES/mL ESCHERICHIA COLI (*)    Organism ID, Bacteria ESCHERICHIA COLI (*)    All other components within normal limits  POCT URINE DIPSTICK - Abnormal; Notable for the following components:   Clarity, UA cloudy (*)    Blood, UA trace-intact (*)    Leukocytes, UA Large (3+) (*)    All other components within normal limits    EKG   Radiology No results found.  Procedures Procedures (including critical care time)  Medications Ordered in UC Medications - No data to display  Initial Impression / Assessment and Plan / UC Course  I have reviewed the triage vital signs and the nursing notes.  Pertinent labs & imaging results that were available during my care of the patient were reviewed by me and considered in my medical decision making (see chart for details).     Urinalysis today with evidence of a urinary tract infection.  Patient specifically requesting Augmentin  as this has worked well for her in the past for this issue.  Discussed that we would start this medication but based on urine culture results we will give her a call if we need you to make any changes.  Push fluids, Azo as needed.  Return for worsening symptoms.  Final Clinical Impressions(s) / UC Diagnoses   Final diagnoses:  Acute lower UTI  Discharge  Instructions   None    ED Prescriptions     Medication Sig Dispense Auth. Provider   amoxicillin -clavulanate (AUGMENTIN ) 875-125 MG tablet Take 1 tablet by mouth every 12 (twelve) hours. 14 tablet Stuart Vernell Norris, [place]      PDMP not reviewed this encounter.   Stuart Vernell Norris, [place] 09/01/23 1642

## 2023-09-08 DIAGNOSIS — M25512 Pain in left shoulder: Secondary | ICD-10-CM | POA: Diagnosis not present

## 2023-09-08 DIAGNOSIS — M542 Cervicalgia: Secondary | ICD-10-CM | POA: Diagnosis not present

## 2023-10-06 DIAGNOSIS — M542 Cervicalgia: Secondary | ICD-10-CM | POA: Diagnosis not present

## 2023-10-14 DIAGNOSIS — M542 Cervicalgia: Secondary | ICD-10-CM | POA: Diagnosis not present

## 2023-10-17 DIAGNOSIS — M542 Cervicalgia: Secondary | ICD-10-CM | POA: Diagnosis not present

## 2023-10-20 DIAGNOSIS — M542 Cervicalgia: Secondary | ICD-10-CM | POA: Diagnosis not present

## 2023-10-23 DIAGNOSIS — M542 Cervicalgia: Secondary | ICD-10-CM | POA: Diagnosis not present

## 2023-12-22 ENCOUNTER — Ambulatory Visit
Admission: RE | Admit: 2023-12-22 | Discharge: 2023-12-22 | Disposition: A | Discharge status: Home / Self Care | Attending: Family Medicine | Admitting: Family Medicine

## 2023-12-22 VITALS — BP 127/79 | HR 80 | Temp 97.6°F | Resp 16

## 2023-12-22 DIAGNOSIS — N390 Urinary tract infection, site not specified: Secondary | ICD-10-CM | POA: Insufficient documentation

## 2023-12-22 LAB — POCT URINE DIPSTICK
Bilirubin, UA: NEGATIVE
Blood, UA: NEGATIVE
Glucose, UA: NEGATIVE mg/dL
Ketones, POC UA: NEGATIVE mg/dL
Nitrite, UA: NEGATIVE
POC PROTEIN,UA: NEGATIVE
Spec Grav, UA: 1.01 (ref 1.010–1.025)
Urobilinogen, UA: 0.2 U/dL
pH, UA: 6.5 (ref 5.0–8.0)

## 2023-12-22 MED ORDER — CEPHALEXIN 500 MG PO CAPS: 500.0000 mg | ORAL_CAPSULE | Freq: Two times a day (BID) | ORAL | 0 refills | Status: AC

## 2023-12-22 NOTE — ED Triage Notes (Signed)
 Urinary frequency, lower back pain, painful urination, lower abdominal pressure and pain x 2 days.

## 2023-12-23 LAB — URINE CULTURE: Culture: NO GROWTH

## 2023-12-23 NOTE — ED Provider Notes (Signed)
 [facility]    CSN: [id number] Arrival date & time: 12/22/23  1142      History   Chief Complaint Chief Complaint  Patient presents with   Urinary Frequency    HPI Christy Stanley is a 68 y.o. female.   Patient presenting today with 2-day history of urinary frequency, lower back aching, dysuria, suprapubic pressure.  Denies fever, chills, hematuria, vaginal symptoms, nausea, vomiting.  So far not trying anything over-the-counter for symptoms.    Past Medical History:  Diagnosis Date   Allergy    Anxiety    Depression    Elbow arthritis    tendionitis, wears brace    There are no active problems to display for this patient.   Past Surgical History:  Procedure Laterality Date   ABDOMINAL HYSTERECTOMY     partial   CESAREAN SECTION     x1   COLONOSCOPY WITH PROPOFOL  N/A 03/13/2015   Procedure: COLONOSCOPY WITH PROPOFOL ;  Surgeon: Gladis MARLA Louder, MD;  Location: [facility];  Service: Endoscopy;  Laterality: N/A;   NECK DISSECTION     x 2, vertebrae fixed   TUBAL LIGATION      OB History   No obstetric history on file.      Home Medications    Prior to Admission medications   Medication Sig Start Date End Date Taking? Authorizing Provider  atorvastatin (LIPITOR) 10 MG tablet TAKE 1 TABLET BY MOUTH EVERY DAY FOR 90 DAYS   Yes [provider]  cephALEXin  (KEFLEX ) 500 MG capsule Take 1 capsule (500 mg total) by mouth 2 (two) times daily. 12/22/23  Yes Stuart Vernell Norris, PA-C  sertraline (ZOLOFT) 100 MG tablet Take 100 mg by mouth daily.   Yes [provider]  amoxicillin -clavulanate (AUGMENTIN ) 875-125 MG tablet Take 1 tablet by mouth every 12 (twelve) hours. 08/28/23   Stuart Vernell Norris, PA-C  LORazepam (ATIVAN) 1 MG tablet TAKE 1 TABLET BY MOUTH AT BEDTIME AS NEEDED FOR TRAVEL, CAMPING AS NEEDED 30 DAYS 09/14/22   [provider]  zolpidem (AMBIEN) 10 MG tablet 1 TABLET AS NEEDED BY MOUTH 3 NIGHTS /WEEK 30 DAYS  08/02/13   [provider]  fluticasone (FLONASE) 50 MCG/ACT nasal spray Place 1 spray into both nostrils as needed.  12/21/12 08/07/18  [provider]  PREMARIN 0.45 MG tablet Take 0.45 mg by mouth daily.  11/13/12 08/07/18  [provider]    Family History Family History  Problem Relation Age of Onset   Heart disease Mother    Hepatitis Mother    Cirrhosis Mother    Prostate cancer Father    Cancer Father     Social History Social History   Tobacco Use   Smoking status: Never   Smokeless tobacco: Never  Vaping Use   Vaping status: Never Used  Substance Use Topics   Alcohol use: No   Drug use: No     Allergies   Morphine and codeine, Sulfa antibiotics, Elemental sulfur, and Nitrofurantoin   Review of Systems Review of Systems PER HPI  Physical Exam Triage Vital Signs ED Triage Vitals  Encounter Vitals Group     BP 12/22/23 1207 127/79     Girls Systolic BP Percentile --      Girls Diastolic BP Percentile --      Boys Systolic BP Percentile --      Boys Diastolic BP Percentile --      Pulse Rate 12/22/23 1207 80  Resp 12/22/23 1207 16     Temp 12/22/23 1207 97.6 F (36.4 C)     Temp Source 12/22/23 1207 Oral     SpO2 12/22/23 1207 93 %     Weight --      Height --      Head Circumference --      Peak Flow --      Pain Score 12/22/23 1208 1     Pain Loc --      Pain Education --      Exclude from Growth Chart --    No data found.  Updated Vital Signs BP 127/79 (BP Location: Right Arm)   Pulse 80   Temp 97.6 F (36.4 C) (Oral)   Resp 16   SpO2 93%   Visual Acuity Right Eye Distance:   Left Eye Distance:   Bilateral Distance:    Right Eye Near:   Left Eye Near:    Bilateral Near:     Physical Exam Vitals and nursing note reviewed.  Constitutional:      Appearance: Normal appearance. She is not ill-appearing.  HENT:     Head: Atraumatic.  Eyes:     Extraocular Movements: Extraocular movements intact.      Conjunctiva/sclera: Conjunctivae normal.  Cardiovascular:     Rate and Rhythm: Normal rate.  Pulmonary:     Effort: Pulmonary effort is normal.  Abdominal:     General: Bowel sounds are normal. There is no distension.     Palpations: Abdomen is soft.     Tenderness: There is no abdominal tenderness. There is no right CVA tenderness, left CVA tenderness or guarding.  Musculoskeletal:        General: Normal range of motion.     Cervical back: Normal range of motion and neck supple.  Skin:    General: Skin is warm and dry.  Neurological:     Mental Status: She is alert and oriented to person, place, and time.  Psychiatric:        Mood and Affect: Mood normal.        Thought Content: Thought content normal.        Judgment: Judgment normal.      UC Treatments / Results  Labs (all labs ordered are listed, but only abnormal results are displayed) Labs Reviewed  POCT URINE DIPSTICK - Abnormal; Notable for the following components:      Result Value   Leukocytes, UA Small (1+) (*)    All other components within normal limits  URINE CULTURE    EKG   Radiology No results found.  Procedures Procedures (including critical care time)  Medications Ordered in UC Medications - No data to display  Initial Impression / Assessment and Plan / UC Course  I have reviewed the triage vital signs and the nursing notes.  Pertinent labs & imaging results that were available during my care of the patient were reviewed by me and considered in my medical decision making (see chart for details).     Vitals and exam reassuring today, urinalysis with evidence of a possible urinary tract infection.  Will treat with fluids, Keflex , urine culture pending, supportive over-the-counter medications and home care.  Return for worsening symptoms.  Final Clinical Impressions(s) / UC Diagnoses   Final diagnoses:  Acute lower UTI   Discharge Instructions   None    ED Prescriptions      Medication Sig Dispense Auth. Provider   cephALEXin  (KEFLEX ) 500 MG capsule Take 1  capsule (500 mg total) by mouth 2 (two) times daily. 10 capsule Stuart Vernell Norris, [place]      PDMP not reviewed this encounter.   Stuart Vernell Norris, [place] 12/23/23 [phone]

## 2023-12-24 ENCOUNTER — Ambulatory Visit (HOSPITAL_COMMUNITY): Payer: Self-pay

## 2023-12-30 DIAGNOSIS — Z1331 Encounter for screening for depression: Secondary | ICD-10-CM | POA: Diagnosis not present

## 2023-12-30 DIAGNOSIS — Z0000 Encounter for general adult medical examination without abnormal findings: Secondary | ICD-10-CM | POA: Diagnosis not present

## 2023-12-30 DIAGNOSIS — R350 Frequency of micturition: Secondary | ICD-10-CM | POA: Diagnosis not present

## 2023-12-30 DIAGNOSIS — Z79899 Other long term (current) drug therapy: Secondary | ICD-10-CM | POA: Diagnosis not present

## 2023-12-30 DIAGNOSIS — Z7189 Other specified counseling: Secondary | ICD-10-CM | POA: Diagnosis not present

## 2023-12-30 DIAGNOSIS — Z23 Encounter for immunization: Secondary | ICD-10-CM | POA: Diagnosis not present

## 2023-12-30 DIAGNOSIS — M5430 Sciatica, unspecified side: Secondary | ICD-10-CM | POA: Diagnosis not present

## 2023-12-30 DIAGNOSIS — E785 Hyperlipidemia, unspecified: Secondary | ICD-10-CM | POA: Diagnosis not present

## 2023-12-30 DIAGNOSIS — F419 Anxiety disorder, unspecified: Secondary | ICD-10-CM | POA: Diagnosis not present

## 2023-12-30 DIAGNOSIS — Z1211 Encounter for screening for malignant neoplasm of colon: Secondary | ICD-10-CM | POA: Diagnosis not present

## 2023-12-30 DIAGNOSIS — K589 Irritable bowel syndrome without diarrhea: Secondary | ICD-10-CM | POA: Diagnosis not present

## 2023-12-30 DIAGNOSIS — M85859 Other specified disorders of bone density and structure, unspecified thigh: Secondary | ICD-10-CM | POA: Diagnosis not present

## 2024-01-06 DIAGNOSIS — Z1231 Encounter for screening mammogram for malignant neoplasm of breast: Secondary | ICD-10-CM | POA: Diagnosis not present
